# Patient Record
Sex: Female | Born: 1955 | Race: White | Hispanic: No | Marital: Single | Smoking: Former smoker
Health system: Southern US, Community
[De-identification: ages and names within clinical notes are randomized; demographics above are authoritative.]

## PROBLEM LIST (undated history)

## (undated) DIAGNOSIS — E079 Disorder of thyroid, unspecified: Secondary | ICD-10-CM

## (undated) DIAGNOSIS — K769 Liver disease, unspecified: Secondary | ICD-10-CM

## (undated) DIAGNOSIS — F329 Major depressive disorder, single episode, unspecified: Secondary | ICD-10-CM

## (undated) DIAGNOSIS — R079 Chest pain, unspecified: Secondary | ICD-10-CM

## (undated) DIAGNOSIS — E039 Hypothyroidism, unspecified: Secondary | ICD-10-CM

## (undated) DIAGNOSIS — M1990 Unspecified osteoarthritis, unspecified site: Secondary | ICD-10-CM

## (undated) DIAGNOSIS — J449 Chronic obstructive pulmonary disease, unspecified: Secondary | ICD-10-CM

## (undated) DIAGNOSIS — F32A Depression, unspecified: Secondary | ICD-10-CM

## (undated) DIAGNOSIS — Z9981 Dependence on supplemental oxygen: Secondary | ICD-10-CM

## (undated) DIAGNOSIS — K219 Gastro-esophageal reflux disease without esophagitis: Secondary | ICD-10-CM

## (undated) DIAGNOSIS — E785 Hyperlipidemia, unspecified: Secondary | ICD-10-CM

## (undated) DIAGNOSIS — I10 Essential (primary) hypertension: Secondary | ICD-10-CM

## (undated) HISTORY — PX: BREAST SURGERY: SHX581

## (undated) HISTORY — PX: BREAST BIOPSY: SHX20

## (undated) HISTORY — PX: CERVICAL DISC SURGERY: SHX588

## (undated) HISTORY — PX: TUBAL LIGATION: SHX77

## (undated) HISTORY — PX: ABDOMINAL HYSTERECTOMY: SHX81

## (undated) HISTORY — PX: COLONOSCOPY W/ POLYPECTOMY: SHX1380

## (undated) HISTORY — DX: Chest pain, unspecified: R07.9

## (undated) HISTORY — PX: EXPLORATORY LAPAROTOMY: SUR591

---

## 2004-05-01 ENCOUNTER — Emergency Department (HOSPITAL_COMMUNITY): Admission: EM | Admit: 2004-05-01 | Discharge: 2004-05-01 | Payer: Self-pay | Admitting: Emergency Medicine

## 2004-05-12 ENCOUNTER — Emergency Department (HOSPITAL_COMMUNITY): Admission: EM | Admit: 2004-05-12 | Discharge: 2004-05-12 | Payer: Self-pay | Admitting: Emergency Medicine

## 2005-06-17 ENCOUNTER — Emergency Department (HOSPITAL_COMMUNITY): Admission: EM | Admit: 2005-06-17 | Discharge: 2005-06-18 | Payer: Self-pay | Admitting: Emergency Medicine

## 2006-04-26 ENCOUNTER — Inpatient Hospital Stay (HOSPITAL_COMMUNITY): Admission: AD | Admit: 2006-04-26 | Discharge: 2006-04-26 | Payer: Self-pay | Admitting: Gynecology

## 2006-04-29 ENCOUNTER — Encounter: Admission: RE | Admit: 2006-04-29 | Discharge: 2006-04-29 | Payer: Self-pay | Admitting: Family Medicine

## 2006-05-07 ENCOUNTER — Encounter: Admission: RE | Admit: 2006-05-07 | Discharge: 2006-05-07 | Payer: Self-pay | Admitting: Family Medicine

## 2006-05-26 ENCOUNTER — Encounter (INDEPENDENT_AMBULATORY_CARE_PROVIDER_SITE_OTHER): Payer: Self-pay | Admitting: *Deleted

## 2006-05-26 ENCOUNTER — Encounter: Admission: RE | Admit: 2006-05-26 | Discharge: 2006-05-26 | Payer: Self-pay | Admitting: General Surgery

## 2006-06-14 ENCOUNTER — Encounter: Admission: RE | Admit: 2006-06-14 | Discharge: 2006-06-14 | Payer: Self-pay | Admitting: General Surgery

## 2006-06-14 ENCOUNTER — Ambulatory Visit (HOSPITAL_BASED_OUTPATIENT_CLINIC_OR_DEPARTMENT_OTHER): Admission: RE | Admit: 2006-06-14 | Discharge: 2006-06-14 | Payer: Self-pay | Admitting: General Surgery

## 2006-06-14 ENCOUNTER — Encounter (INDEPENDENT_AMBULATORY_CARE_PROVIDER_SITE_OTHER): Payer: Self-pay | Admitting: Specialist

## 2007-06-08 ENCOUNTER — Encounter: Admission: RE | Admit: 2007-06-08 | Discharge: 2007-06-08 | Payer: Self-pay | Admitting: Family Medicine

## 2007-12-19 ENCOUNTER — Encounter: Admission: RE | Admit: 2007-12-19 | Discharge: 2007-12-19 | Payer: Self-pay | Admitting: Family Medicine

## 2008-06-19 ENCOUNTER — Ambulatory Visit (HOSPITAL_COMMUNITY): Admission: RE | Admit: 2008-06-19 | Discharge: 2008-06-20 | Payer: Self-pay | Admitting: Specialist

## 2009-10-03 ENCOUNTER — Encounter (INDEPENDENT_AMBULATORY_CARE_PROVIDER_SITE_OTHER): Payer: Self-pay | Admitting: *Deleted

## 2009-11-28 ENCOUNTER — Encounter (INDEPENDENT_AMBULATORY_CARE_PROVIDER_SITE_OTHER): Payer: Self-pay | Admitting: *Deleted

## 2009-12-09 ENCOUNTER — Encounter: Admission: RE | Admit: 2009-12-09 | Discharge: 2009-12-09 | Payer: Self-pay | Admitting: Internal Medicine

## 2009-12-12 ENCOUNTER — Ambulatory Visit (HOSPITAL_COMMUNITY): Admission: RE | Admit: 2009-12-12 | Discharge: 2009-12-12 | Payer: Self-pay | Admitting: *Deleted

## 2009-12-16 ENCOUNTER — Encounter (INDEPENDENT_AMBULATORY_CARE_PROVIDER_SITE_OTHER): Payer: Self-pay | Admitting: *Deleted

## 2009-12-16 ENCOUNTER — Ambulatory Visit: Payer: Self-pay | Admitting: Gastroenterology

## 2009-12-16 DIAGNOSIS — K5909 Other constipation: Secondary | ICD-10-CM | POA: Insufficient documentation

## 2009-12-16 DIAGNOSIS — K625 Hemorrhage of anus and rectum: Secondary | ICD-10-CM

## 2009-12-16 DIAGNOSIS — G894 Chronic pain syndrome: Secondary | ICD-10-CM | POA: Insufficient documentation

## 2009-12-18 ENCOUNTER — Ambulatory Visit: Payer: Self-pay | Admitting: Gastroenterology

## 2009-12-18 ENCOUNTER — Telehealth: Payer: Self-pay | Admitting: Gastroenterology

## 2009-12-20 ENCOUNTER — Encounter: Payer: Self-pay | Admitting: Gastroenterology

## 2010-07-29 NOTE — Miscellaneous (Signed)
Summary: bentyl rx.  Clinical Lists Changes  Medications: Added new medication of BENTYL 10 MG  CAPS (DICYCLOMINE HCL) take one tab three times a day before meals. - Signed Rx of BENTYL 10 MG  CAPS (DICYCLOMINE HCL) take one tab three times a day before meals.;  #100 x 3;  Signed;  Entered by: Darlyn Read RN;  Authorized by: Mardella Layman MD [facility];  Method used: Electronically to [facility]. [phone]*, [address] [place], [place], [place]  [address], Ph: [phone] or [phone], Fax: [phone]    Prescriptions: BENTYL 10 MG  CAPS (DICYCLOMINE HCL) take one tab three times a day before meals.  #100 x 3   Entered by:   Darlyn Read RN   Authorized by:   Mardella Layman MD [facility]   Signed by:   Darlyn Read RN on 12/18/2009   Method used:   Electronically to        [facility] [phone]* (retail)       [address] [place], [place]  [address]       Ph: [phone] or [phone]       Fax: [phone]   RxID:   [id number]

## 2010-07-29 NOTE — Letter (Signed)
Summary: New Patient letter  [facility]  [address] [place], [place] [address]   Phone: [phone]  Fax: [phone]       11/28/2009 MRN: [id number]  KENNETHA PEARMAN [address] [place], [place]  [address]  Dear Ms. [name],  Welcome to the Gastroenterology Division at [facility].    You are scheduled to see Dr. Jarold Motto on 12/16/2009 at 2:45PM on the 3rd floor at [facility], [address].  We ask that you try to arrive at our office 15 minutes prior to your appointment time to allow for check-in.  We would like you to complete the enclosed self-administered evaluation form prior to your visit and bring it with you on the day of your appointment.  We will review it with you.  Also, please bring a complete list of all your medications or, if you prefer, bring the medication bottles and we will list them.  Please bring your insurance card so that we may make a copy of it.  If your insurance requires a referral to see a specialist, please bring your referral form from your primary care physician.  Co-payments are due at the time of your visit and may be paid by cash, check or credit card.     Your office visit will consist of a consult with your physician (includes a physical exam), any laboratory testing he/she may order, scheduling of any necessary diagnostic testing (e.g. x-ray, ultrasound, CT-scan), and scheduling of a procedure (e.g. Endoscopy, Colonoscopy) if required.  Please allow enough time on your schedule to allow for any/all of these possibilities.    If you cannot keep your appointment, please call [phone] to cancel or reschedule prior to your appointment date.  This allows [place] the opportunity to schedule an appointment for another patient in need of care.  If you do not cancel or reschedule by 5 p.m. the business day prior to your appointment date, you will be charged a $50.00 late cancellation/no-show fee.    Thank you for  choosing [facility] for your medical needs.  We appreciate the opportunity to care for you.  Please visit [place] at our website  to learn more about our practice.                     Sincerely,                                                             The Gastroenterology Division

## 2010-07-29 NOTE — Letter (Signed)
Summary: New Patient letter  [facility]  [address] [place], [place] [address]   Phone: [phone]  Fax: [phone]       10/03/2009 MRN: [id number]  Stephanie Buck [address] [place], [place]  [address]  Dear Ms. Stephanie Buck,  Welcome to the Gastroenterology Division at [facility].    You are scheduled to see Dr. Arlyce Dice on 11-04-09 at 8:30a.m. on the 3rd floor at [facility], [address].  We ask that you try to arrive at our office 15 minutes prior to your appointment time to allow for check-in.  We would like you to complete the enclosed self-administered evaluation form prior to your visit and bring it with you on the day of your appointment.  We will review it with you.  Also, please bring a complete list of all your medications or, if you prefer, bring the medication bottles and we will list them.  Please bring your insurance card so that we may make a copy of it.  If your insurance requires a referral to see a specialist, please bring your referral form from your primary care physician.  Co-payments are due at the time of your visit and may be paid by cash, check or credit card.     Your office visit will consist of a consult with your physician (includes a physical exam), any laboratory testing he/she may order, scheduling of any necessary diagnostic testing (e.g. x-ray, ultrasound, CT-scan), and scheduling of a procedure (e.g. Endoscopy, Colonoscopy) if required.  Please allow enough time on your schedule to allow for any/all of these possibilities.    If you cannot keep your appointment, please call [phone] to cancel or reschedule prior to your appointment date.  This allows [place] the opportunity to schedule an appointment for another patient in need of care.  If you do not cancel or reschedule by 5 p.m. the business day prior to your appointment date, you will be charged a $50.00 late cancellation/no-show fee.    Thank you for  choosing [facility] for your medical needs.  We appreciate the opportunity to care for you.  Please visit [place] at our website  to learn more about our practice.                     Sincerely,                                                             The Gastroenterology Division

## 2010-07-29 NOTE — Letter (Signed)
Summary: Patient Notice- Polyp Results  [facility]  [address] [place], [place] [address]   Phone: [phone]  Fax: [phone]        December 20, 2009 MRN: [id number]    Stephanie Buck [address] [place], [place]  [address]    Dear Ms. [name],  I am pleased to inform you that the colon polyp(s) removed during your recent colonoscopy was (were) found to be benign (no cancer detected) upon pathologic examination.  I recommend you have a repeat colonoscopy examination in 5_ years to look for recurrent polyps, as having colon polyps increases your risk for having recurrent polyps or even colon cancer in the future.  Should you develop new or worsening symptoms of abdominal pain, bowel habit changes or bleeding from the rectum or bowels, please schedule an evaluation with either your primary care physician or with me.  Additional information/recommendations:  __ No further action with gastroenterology is needed at this time. Please      follow-up with your primary care physician for your other healthcare      needs.  __ Please call [phone] to schedule a return visit to review your      situation.  __ Please keep your follow-up visit as already scheduled.  X__ Continue treatment plan as outlined the day of your exam.  Please call us if you are having persistent problems or have questions about your condition that have not been fully answered at this time.  Sincerely,  Mardella Layman MD [facility]  This letter has been electronically signed by your physician.  Appended Document: Patient Notice- Polyp Results letter mailed.

## 2010-07-29 NOTE — Letter (Signed)
Summary: [facility] Instructions  [facility]  [address] [place], [place] [address]   Phone: [phone]  Fax: [phone]       Stephanie Buck    Dec 16, 1955    MRN: [id number]        Procedure Day Dorna Bloom: Wednesday, 12/18/09     Arrival Time: 7:30      Procedure Time: 8:30     Location of Procedure:                    _X _  [facility] (4th Floor)                        PREPARATION FOR COLONOSCOPY WITH MOVIPREP   Starting today do not eat nuts, seeds, popcorn, corn, beans, peas,  salads, or any raw vegetables.  Do not take any fiber supplements (e.g. Metamucil, Citrucel, and Benefiber).  THE DAY BEFORE YOUR PROCEDURE         DATE: 12/17/09    DAY: Tuesday  1.  Drink clear liquids the entire day-NO SOLID FOOD  2.  Do not drink anything colored red or purple.  Avoid juices with pulp.  No orange juice.  3.  Drink at least 64 oz. (8 glasses) of fluid/clear liquids during the day to prevent dehydration and help the prep work efficiently.  CLEAR LIQUIDS INCLUDE: Water Jello Ice Popsicles Tea (sugar ok, no milk/cream) Powdered fruit flavored drinks Coffee (sugar ok, no milk/cream) Gatorade Juice: apple, white grape, white cranberry  Lemonade Clear bullion, consomm, broth Carbonated beverages (any kind) Strained chicken noodle soup Hard Candy                             4.  In the morning, mix first dose of MoviPrep solution:    Empty 1 Pouch A and 1 Pouch B into the disposable container    Add lukewarm drinking water to the top line of the container. Mix to dissolve    Refrigerate (mixed solution should be used within 24 hrs)  5.  Begin drinking the prep at 5:00 p.m. The MoviPrep container is divided by 4 marks.   Every 15 minutes drink the solution down to the next mark (approximately 8 oz) until the full liter is complete.   6.  Follow completed prep with 16 oz of clear liquid of your choice (Nothing red or purple).  Continue to  drink clear liquids until bedtime.  7.  Before going to bed, mix second dose of MoviPrep solution:    Empty 1 Pouch A and 1 Pouch B into the disposable container    Add lukewarm drinking water to the top line of the container. Mix to dissolve    Refrigerate  THE DAY OF YOUR PROCEDURE      DATE: 12/18/09   DAY: Wednesday  Beginning at 3:30a.m. (5 hours before procedure):         1. Every 15 minutes, drink the solution down to the next mark (approx 8 oz) until the full liter is complete.  2. Follow completed prep with 16 oz. of clear liquid of your choice.    3. You may drink clear liquids until 6:30 (2 HOURS BEFORE PROCEDURE).   MEDICATION INSTRUCTIONS  Unless otherwise instructed, you should take regular prescription medications with a small sip of water   as early as possible the morning of your procedure.  OTHER INSTRUCTIONS  You will need a responsible adult at least 55 years of age to accompany you and drive you home.   This person must remain in the waiting room during your procedure.  Wear loose fitting clothing that is easily removed.  Leave jewelry and other valuables at home.  However, you may wish to bring a book to read or  an iPod/MP3 player to listen to music as you wait for your procedure to start.  Remove all body piercing jewelry and leave at home.  Total time from sign-in until discharge is approximately 2-3 hours.  You should go home directly after your procedure and rest.  You can resume normal activities the  day after your procedure.  The day of your procedure you should not:   Drive   Make legal decisions   Operate machinery   Drink alcohol   Return to work  You will receive specific instructions about eating, activities and medications before you leave.    The above instructions have been reviewed and explained to me by   _______________________    I fully understand and can verbalize these instructions  _____________________________ Date _________

## 2010-07-29 NOTE — Procedures (Signed)
Summary: Colonoscopy  Patient: [name] Note: All result statuses are Final unless otherwise noted.  Tests: (1) Colonoscopy (COL)   COL Colonoscopy           DONE     [facility]     [address]     [place], [place]  [address]           COLONOSCOPY PROCEDURE REPORT           PATIENT:  Stephanie Buck, Stephanie Buck  MR#:  [id number]     BIRTHDATE:  1956-05-21, 53 yrs. old  GENDER:  female     ENDOSCOPIST:  Vania Rea. Jarold Motto, MD, [facility]     REF. BY:     PROCEDURE DATE:  12/18/2009     PROCEDURE:  Colonoscopy with snare polypectomy     ASA CLASS:  Class II     INDICATIONS:  colorectal cancer screening, average risk     MEDICATIONS:   Fentanyl 100 mcg IV, Versed 10 mg IV           DESCRIPTION OF PROCEDURE:   After the risks benefits and     alternatives of the procedure were thoroughly explained, informed     consent was obtained.  Digital rectal exam was performed and     revealed perianal skin tags.   The LB CF-[id number] endoscope     was introduced through the anus and advanced to the cecum, which     was identified by both the appendix and ileocecal valve, without     limitations.  The quality of the prep was excellent, using     MoviPrep.  The instrument was then slowly withdrawn as the colon     was fully examined.     <<PROCEDUREIMAGES>>           FINDINGS:  Severe diverticulosis was found in the sigmoid to     descending colon segments.  There were multiple polyps identified     and removed. in the sigmoid to descending colon segments. 3-9 mm     flat polyps hot snare excised.  Internal and external hemorrhoids     were found.   Retroflexed views in the rectum revealed external     hemorrhoids.    The scope was then withdrawn from the patient and     the procedure completed.           COMPLICATIONS:  None     ENDOSCOPIC IMPRESSION:     1) Severe diverticulosis in the sigmoid to descending colon     segments     2) Polyps, multiple in the sigmoid to descending  colon segments           3) Internal and external hemorrhoids     R/O ADENOMAS.     RECOMMENDATIONS:     1) high fiber diet     2) metamucil or benefiber     3) Repeat Colonoscopy in 5 years.     REPEAT EXAM:  No           ______________________________     Vania Rea. Jarold Motto, MD, [facility]           CC:           n.     eSIGNED:   Vania Rea. Patterson at 12/18/2009 09:00 AM           [name], [id number]  Note: An exclamation mark (!) indicates a result that was not dispersed into  the flowsheet. Document Creation Date: 12/18/2009 9:00 AM _______________________________________________________________________  (1) Order result status: Final Collection or observation date-time: 12/18/2009 08:53 Requested date-time:  Receipt date-time:  Reported date-time:  Referring Physician:   Ordering Physician: Sheryn Bison [phone]) Specimen Source:  Source: Launa Grill Order Number: [id number] Lab site:   Appended Document: Colonoscopy 5Y F/U  Appended Document: Colonoscopy     Procedures Next Due Date:    Colonoscopy: 11/2014

## 2010-07-29 NOTE — Miscellaneous (Signed)
Summary: Librax Rx  Clinical Lists Changes  Medications: Added new medication of LIBRAX 2.5-5 MG  CAPS (CLIDINIUM-CHLORDIAZEPOXIDE) ac three times a day - Signed Rx of LIBRAX 2.5-5 MG  CAPS (CLIDINIUM-CHLORDIAZEPOXIDE) ac three times a day;  #100 x 6;  Signed;  Entered by: Doristine Church RN II;  Authorized by: Mardella Layman MD [facility];  Method used: Electronically to [facility]. [phone]*, [address] [place], [place], [place]  [address], Ph: [phone] or [phone], Fax: [phone] Observations: Added new observation of ALLERGY REV: Done (12/18/2009 9:16) Added new observation of NKA: T (12/18/2009 9:16)    Prescriptions: LIBRAX 2.5-5 MG  CAPS (CLIDINIUM-CHLORDIAZEPOXIDE) ac three times a day  #100 x 6   Entered by:   Doristine Church RN II   Authorized by:   Mardella Layman MD [facility]   Signed by:   Doristine Church RN II on 12/18/2009   Method used:   Electronically to        [facility] [phone]* (retail)       [address] [place], [place]  [address]       Ph: [phone] or [phone]       Fax: [phone]   RxID:   [id number]

## 2010-07-29 NOTE — Progress Notes (Signed)
Summary: insurance will not cover med  Phone Note Call from Patient Call back at Home Phone [phone]   Caller: Patient Call For: Jarold Motto Reason for Call: Talk to Nurse Summary of Call: Patient states that her insurance company will not cover LIbrax, is there an alternative medication? Initial call taken by: Tawni Levy,  December 18, 2009 4:03 PM  Follow-up for Phone Call        RETURNED PHONE CALL AND CALL PATIENT AN ALTERNATIVE MEDICATION INTO PHARMACY. Follow-up by: Darlyn Read RN,  December 18, 2009 4:07 PM

## 2010-07-29 NOTE — Assessment & Plan Note (Signed)
Summary: GI BLEED...AS.   History of Present Illness Visit Type: consult Primary GI MD: Sheryn Bison MD FACP FAGA Primary Provider: Quitman Livings, MD Requesting Provider: Quitman Livings, MD Chief Complaint: pain in right side and occasional blood seen on toilet tissue, no blood on stool or in toilet History of Present Illness:   55 year old Caucasian female who has had a symptomatic rectal bleeding periodically for the last month with associated crampy right lower quadrant pain which actually improved with Cipro therapy for presumed UTI. She has mild constipation and intermittent bright red blood per rectum. Apparently lab data has been checked and she is not anemic although I do not have all of his records. Family history is remarkable for colon carcinoma in an aunt at age 76. Patient has had previous hysterectomy and probable bilateral ovary removal. 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 She" strangle easily" but denies dysphasia or chronic GERD. She has not had previous endoscopic exams of her gut. She denies abuse of alcohol but does smoke regularly. She also takes p.r.n. ibuprofen for pain. She is disabled.   GI Review of Systems    Reports abdominal pain, bloating, nausea, and  weight gain.      Denies acid reflux, belching, chest pain, dysphagia with liquids, dysphagia with solids, heartburn, loss of appetite, vomiting, vomiting blood, and  weight loss.      Reports change in bowel habits.     Denies anal fissure, black tarry stools, constipation, diarrhea, diverticulosis, fecal incontinence, heme positive stool, hemorrhoids, irritable bowel syndrome, jaundice, light color stool, liver problems, rectal bleeding, and  rectal pain. Preventive Screening-Counseling & Management  Alcohol-Tobacco     Smoking Status: current      Drug Use:  no.      Current Medications (verified): 1)  Percocet 5-325 Mg Tabs  (Oxycodone-Acetaminophen) .... Take One Tablet Once Daily As Needed 2)  Alprazolam 0.5 Mg Tabs (Alprazolam) .... Take 1 Tablet By Mouth Two Times A Day 3)  Lovastatin 20 Mg Tabs (Lovastatin) .... Take 1 Tablet By Mouth Once A Day 4)  Proventil Hfa 108 (90 Base) Mcg/act Aers (Albuterol Sulfate) .... As Needed 5)  Ibuprofen 800 Mg Tabs (Ibuprofen) .... Take As Needed  Allergies (verified): No Known Drug Allergies  Past History:  Past medical, surgical, family and social histories (including risk factors) reviewed for relevance to current acute and chronic problems.  Past Medical History: Anxiety Disorder Asthma Hyperlipidemia  Past Surgical History: Hysterectomy  Family History: Reviewed history and no changes required. No FH of Colon Cancer: Family History of Prostate Cancer: Father Family History of Colon Polyps: Mat. Aunt Family History of Diabetes: Father, Mother, PGM, MGM, Sister Family History of Heart Disease: Brother, PGF Family History of Kidney Disease: Sister(not sister w/diabetes)  Social History: Reviewed history and no changes required. Divorced, 1 boy Disabled Patient currently smokes.  Alcohol Use - no Daily Caffeine Use 2/day Illicit Drug Use - no Smoking Status:  current Drug Use:  no  Review of Systems       The patient complains of back pain, breast changes/lumps, change in vision, cough, fatigue, muscle pains/cramps, shortness of breath, sleeping problems, urination - excessive, and urine leakage.  The patient denies allergy/sinus, anemia, anxiety-new, arthritis/joint pain, blood in urine, confusion, coughing up blood, depression-new, fainting, fever, headaches-new, hearing problems, heart murmur, heart rhythm changes, itching, menstrual pain, night sweats, nosebleeds, pregnancy symptoms, skin rash, sore throat,  swelling of feet/legs, swollen lymph glands, thirst - excessive, urination changes/pain, and voice change.    Vital Signs:  Patient profile:    55 year old female Height:      65 inches Weight:      182 pounds BMI:     30.40 BSA:     1.90 Pulse rate:   100 / minute Pulse rhythm:   regular BP sitting:   106 / 64  (left arm) Cuff size:   regular  Vitals Entered By: Francee Piccolo CMA Duncan Dull) (December 16, 2009 2:46 PM)  Physical Exam  General:  Well developed, well nourished, no acute distress.healthy appearing.   Head:  Normocephalic and atraumatic. Eyes:  PERRLA, no icterus.exam deferred to patient's ophthalmologist.   Neck:  Supple; no masses or thyromegaly. Lungs:  Clear throughout to auscultation. Heart:  Regular rate and rhythm; no murmurs, rubs,  or bruits. Abdomen:  Soft, nontender and nondistended. No masses, hepatosplenomegaly or hernias noted. Normal bowel sounds. Rectal:  Normal exam.hemocult negative.   Msk:  Symmetrical with no gross deformities. Normal posture. Pulses:  Normal pulses noted. Extremities:  No clubbing, cyanosis, edema or deformities noted. Neurologic:  Alert and  oriented x4;  grossly normal neurologically. Skin:  Intact without significant lesions or rashes. Cervical Nodes:  No significant cervical adenopathy. Psych:  Alert and cooperative. Normal mood and affect.   Impression & Recommendations:  Problem # 1:  RECTAL BLEEDING (ICD-569.3) Assessment Improved Probable hemorrhoidal bleeding--rule out colonic polyposis. Colonoscopy has been scheduled at her convenience. Orders: Colonoscopy (Colon)  Problem # 2:  OTHER CONSTIPATION (ICD-564.09) Assessment: Improved High-Fiber diet as tolerated with liberal p.o. fluids. Orders: Colonoscopy (Colon)  Problem # 3:  FAMILY HX COLON CANCER (ICD-V16.0) Assessment: Comment Only  Problem # 4:  CHRONIC PAIN SYNDROME (ICD-338.4) Assessment: Unchanged  Patient Instructions: 1)  You are scheduled for a colonoscopy.   2)  You may pick up your prep at your pharmacy. 3)  Please continue current medications.  4)  The medication list was reviewed  and reconciled.  All changed / newly prescribed medications were explained.  A complete medication list was provided to the patient / caregiver. 5)  High Fiber, Low Fat  Healthy Eating Plan brochure given.  6)  Constipation and Hemorrhoids brochure given.  7)  Colonoscopy and Flexible Sigmoidoscopy brochure given.  8)  Conscious Sedation brochure given.  9)  Copy sent to : Dr.Sami Hassan. Prescriptions: MOVIPREP 100 GM  SOLR (PEG-KCL-NACL-NASULF-NA ASC-C) As per prep instructions.  #1 x 0   Entered by:   Ashok Cordia RN   Authorized by:   Mardella Layman MD [facility]   Signed by:   Ashok Cordia RN on 12/16/2009   Method used:   Electronically to        [facility] [phone]* (retail)       [address] [place], [place]  [address]       Ph: [phone] or [phone]       Fax: [phone]   RxID:   [id number]

## 2010-11-11 NOTE — Op Note (Signed)
[name], Stephanie Buck               ACCOUNT NO.:  [id number]   MEDICAL RECORD NO.:  [id number]          PATIENT TYPE:  OIB   LOCATION:  5016                         FACILITY:  [facility]   PHYSICIAN:  Kerrin Champagne, M.D.   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   POSTOPERATIVE DIAGNOSIS:  Central right-sided herniated nucleus pulposus  C5-6 affecting the right C6 and C7 nerve roots.   PROCEDURE:  Anterior cervical diskectomy and fusion at the C5-6 level  with 8-mm transgraft and a local bone graft harvested, 14 mm Trestle  Alphatec plate with 84-XL screws.   ASSISTANT:  Wende Neighbors, P.A.   ANESTHESIA:  General via orotracheal intubation, Dr. Krista Blue.   FINDINGS:  HNP, right L5-S1, right C6 nerve root foraminal entrapment,  degenerative disk changes.   ESTIMATED BLOOD LOSS:  50 mL.   COMPLICATIONS:  None.   DRAINS:  Single TLS drain, 7 [place], anterior neck.  The patient  returned to the PACU in good condition.   HISTORY OF PRESENT ILLNESS:  The patient is a 55 year old female who has  been experiencing severe neck pain and radiation to the right upper  extremity with numbness and paresthesias.  She has been evaluated by  hand Dr. Cindee Salt, felt to have findings most consistent with cervical  radiculopathy, was sent for evaluation.  She has diffuse weakness in the  right upper extremity involving C6-C7 nerve roots, and her biceps  reflexes diminished on the right side.  The patient underwent attempts  at conservative management.  However, these were unsuccessful  unfortunately, and unable to tolerate steroid medicines.  MRI scan  demonstrates a large protruded disk, right side, C6-7, C6 nerve root  compression, C4-5 with a small central protrusion, touching the  thecal  sac, but not causing cord compression, and a right-sided foraminal  narrowing at C6 and C7.   FINDINGS:  As her findings primarily seem to represent that of a C6 with  a disk protrusion to the right side affecting the right side of the  thecal sac, potentially affecting the C7 nerve root before its entry  into the C7 neural foramen.  The patient is felt to be a candidate for a  primary single-level anterior discectomy with fusion at C5-6 alone.  It  was felt that a two-level fusion likely would result in necessity for a  three-level fusion due to the changes either above or below at this  single fusion site.  The patient is brought to the operating room to  undergo anterior diskectomy and fusion at C5-C6.   INTRAOPERATIVE FINDINGS:  The patient has spondylosis changes and disk  protrusion on the right side, and spondylosis involving the posterior  lip inferiorly at C5.  It did compress the C7 nerve root in the lateral  aspect of the thecal sac.   DESCRIPTION OF THE PROCEDURE:  After adequate general anesthesia, the  patient was placed on the operating room table.  She had her head held  by well-added [place] horseshoe with the arms at the sides, skids in  place well padded.  TED hose and PAS stockings.  Standard prep with  DuraPrep solution over the anterior neck, 5 pounds cervical Holter  traction.  Standard preoperative antibiotics with Ancef.  She had  marking on the left neck prior to the surgical procedure.  Intraoperative time out identifying the patient and the procedure to be  performed.  She had an incision in line with the patient's skin creases  and about the C5-6 level localized with the cricothyroid cartilage.  Incision through skin and subcutaneous layers directly down the  platysma, approximately 3-3-1/2 inches in length.  Platysma layer was  then divided in line with the skin incision.  Bleeders controlled using  bipolar electrocautery.  The incision was  spread, omohyoid muscle  identified, retracted and divided.  Interval between the trachea and  esophagus medially and carotid sheath laterally then developed with the  blunt dissection at the anterior aspect of the cervical spine.   Small veins crossing the prevertebral fascia from the central to lateral  was carefully suture ligated with 2-0 Vicryl suture and divided.  The  patient's esophagus and trachea were then mobilized laterally, and the  anterior aspect of the cervical spine identified.  Carotid tubercle  identified and palpated.  This represented the C6 level.  A level just  directly above this felt to be C5-C6 disk space and a needle bed in such  a manner as to only allow insertion of a centimeter, so the needle was  placed at this level.  Intraoperative lateral radiograph demonstrated  the needle at the C5-6 level for planned surgery.  Medial borderof the  longus coli muscle carefully freed up on either side of the disk space  at C5-6, and Bose-McCullough retractor inserted obtaining excellent  exposure.   A 15-blade scalpel was used incised the disk.  Pituitary rongeur used to  debride the disk.  Bony anterior lip osteophytes were resected and  preserved for lateral bone grafting purposes in the disk space.  Further  bone graft tear was obtained from posterior lip osteophytes as well as  bone along the anterior edges and margins of the disk space.  Curettage  carried out over the disk space removing any degenerative disk material  and cartilaginous end-plates of inferior C5 and superior C6, and disk  space debrided with degenerative disk material back to the posterior lip  osteophytes and degenerated annular fibers.  Operating room microscope  sterilely draped run into the field under direct observation, and the  posterior aspect of the disk was then debrided to the patient's  posterior longitudinal ligament.  This was then resected in total with  posterior lip osteophyte  resected out the posterior aspect of the C5 and  C6 levels inferiorly.  Right C6 neural foramen was then carefully  debrided of degenerative disk material removing pressure on the nerve  root area.  Foraminotomy performed at this level till the nerve was  completely freed.   Next, the end-plate was carefully debrided using high-speed bur down the  bony end-plates measured for height 7 and 8 mm.  The 8 mm was felt to  represent the best height.  Transgraft was brought on to the field and  the bone graft harvested was then packed with a central opening.  Irrigation of the disk space carried out.  Careful inspection  of the  disk space demonstrated that no bony debridement could be retropulsed at  the insertion of the graft.  The graft was then inserted and packed into  place, retroset beneath the anterior lip of the disk space about 1 to 2  mm.  A 5-ounce cervical Holter traction was released.  The patient's  screw posts were placed on the vertebral body at C5 and C6 were removed  and bone wax applied to bleeding cancellous bone surfaces.   A 14 mm Trestel plate was then placed across the anterior cervical spine  as a single retaining pin on the left side, C6 and a first screw was  placed on the left side at the C5 level screwing this home, after first  drilling with a 14-mm drill, positive stop.  A 14-mm screw was placed  obtaining excellent purchase.  The retaining pin on the left side at C6  was removed, drill then applied, and then another 14-mm screw was placed  each of these capturing the plate locking mechanism quite nicely.  Attention was then turned to the right side at C6 and then drilling with  a 14-mm drill, and then placing the screw at C5 level on the right side,  again drilling with a 14-mm drill placing the appropriate screw.   Intraoperative radiographs obtained with some slight traction of the  arms demonstrating the screws, plate, and graft in excellent position  and  alignment.  No sign of bone graft or retropulsion of bone graft or  instrumentation.  Further irrigation was then carried out.  Careful  inspection of the incision demonstrated no active bleeding present.  A 7-  [place] drain place at the depth of the incision exiting over the  anterior and inferior aspect of the incision site.  The omohyoid muscle  reapproximated with interrupted 3-0 Vicryl sutures.  The platysma layer  approximated with interrupted 3-0 Vicryl sutures, deep subcu layers with  interrupted 3-0 Vicryl sutures, and the skin closed with a running subcu  stitch with 4-0 Monocryl.  The patient then had Dermabond applied.  Note  the DLS drain was sewn in place.  10French tube.  Dressing 4 x 4's was  fixed to the skin with a hyperfixed tape, and a [place] collar  applied.  The patient was then reactivated, extubated, and returned to  the recovery room in satisfactory condition.  Note that all the  instrument and sponge counts were correct  Intraoperative time out,  standard preoperative protocol marking the incision initially  preoperatively.      Kerrin Champagne, M.D.  Electronically Signed     JEN/MEDQ  D:  06/19/2008  T:  06/20/2008  Job:  [id number]

## 2010-11-14 NOTE — Op Note (Signed)
NAMEJAMYRAH, Stephanie Buck               ACCOUNT NO.:  [id number]   MEDICAL RECORD NO.:  [id number]          PATIENT TYPE:  AMB   LOCATION:  DSC                          FACILITY:  [facility]   PHYSICIAN:  Leonie Man, M.D.   DATE OF BIRTH:  06-Jun-1956   DATE OF PROCEDURE:  06/14/2006  DATE OF DISCHARGE:                               OPERATIVE REPORT   PREOPERATIVE DIAGNOSES:  Right breast lesion, rule out carcinoma.   POSTOPERATIVE DIAGNOSIS:  Right breast lesion, rule out carcinoma.  Pathology pending.   PROCEDURE:  Is needle localization and excisional biopsy of right breast  lesion.   SURGEON:  Dr. Lurene Shadow   ASSISTANT:  OR tech   ANESTHESIA:  General.   SPECIMEN:  A lot of breast tissue right breast.   ESTIMATED BLOOD LOSS:  Was minimal.   COMPLICATIONS:  Is none.  The patient returned to PACU in excellent  condition.   INDICATIONS FOR PROCEDURE:  Stephanie Buck is a 55 year old female with a  strong family history of breast carcinoma who on screening mammogram is  noted to have a lesion in the right breast, evaluated by core needle  biopsy which showed this to be a benign lesion.  However, because of the  morphology of the lesion excisional biopsy is indicated.  The patient  comes to the operating room now after risks and potential benefits of  surgery have been discussed.  All questions answered and consent  obtained.   PROCEDURE:  The patient positioned supinely following the induction of  general anesthesia the right breast was prepped and draped to be  included in a sterile operative field.  The localizing needle in the  center of the field.  An elliptical incision was carried down around the  localizing needle.  This was deepened through skin and subcutaneous  tissues raising flaps and all directions and carrying a wide margin of  tissue around the localizing needle almost to the chest wall.  The  entire specimen removed and forward for specimen mammography.  Specimen  mammography showed the clip to be in the center of the incision along  with the mass.  The specimen was then forwarded for pathologic  evaluation.  Pathology is pending.  The breast was then checked for  hemostasis.  Additional bleeding points treated with electrocautery.  Sponge and instrument counts verified.  The breast tissues were  reapproximated with interrupted 2-0 Vicryl sutures.  Subcutaneous  tissues with interrupted 3-0 Vicryl sutures and the skin closed  with 4-0 Monocryl running intradermal stitch.  This was then reinforced  with Steri-Strips.  Sterile dressings applied.  The anesthetic reversed  and the patient removed from the operating room to the recovery room in  stable condition.  She tolerated the procedure well.      Leonie Man, M.D.  Electronically Signed     PB/MEDQ  D:  06/14/2006  T:  06/14/2006  Job:  [id number]

## 2010-12-30 ENCOUNTER — Other Ambulatory Visit: Payer: Self-pay | Admitting: Internal Medicine

## 2010-12-30 DIAGNOSIS — Z1231 Encounter for screening mammogram for malignant neoplasm of breast: Secondary | ICD-10-CM

## 2011-01-06 ENCOUNTER — Ambulatory Visit
Admission: RE | Admit: 2011-01-06 | Discharge: 2011-01-06 | Disposition: A | Discharge status: Home / Self Care | Payer: Medicare Other | Source: Ambulatory Visit | Attending: Internal Medicine | Admitting: Internal Medicine

## 2011-01-06 DIAGNOSIS — Z1231 Encounter for screening mammogram for malignant neoplasm of breast: Secondary | ICD-10-CM

## 2011-04-03 LAB — COMPREHENSIVE METABOLIC PANEL
ALT: 52 U/L — ABNORMAL HIGH (ref 0–35)
AST: 46 U/L — ABNORMAL HIGH (ref 0–37)
Albumin: 3.7 g/dL (ref 3.5–5.2)
Alkaline Phosphatase: 162 U/L — ABNORMAL HIGH (ref 39–117)
BUN: 13 mg/dL (ref 6–23)
CO2: 32 mEq/L (ref 19–32)
Calcium: 9.6 mg/dL (ref 8.4–10.5)
Chloride: 104 mEq/L (ref 96–112)
Creatinine, Ser: 0.81 mg/dL (ref 0.4–1.2)
GFR calc Af Amer: >60 mL/min (ref >60)
GFR calc non Af Amer: >60 mL/min (ref >60)
Glucose, Bld: 96 mg/dL (ref 70–99)
Potassium: 4.9 mEq/L (ref 3.5–5.1)
Sodium: 142 mEq/L (ref 135–145)
Total Bilirubin: 0.5 mg/dL (ref 0.3–1.2)
Total Protein: 6.6 g/dL (ref 6.0–8.3)

## 2011-04-03 LAB — CBC
HCT: 43.6 % (ref 36.0–46.0)
Hemoglobin: 14.8 g/dL (ref 12.0–15.0)
MCHC: 34 g/dL (ref 30.0–36.0)
MCV: 93.6 fL (ref 78.0–100.0)
Platelets: 304 10*3/uL (ref 150–400)
RBC: 4.66 MIL/uL (ref 3.87–5.11)
RDW: 13.6 % (ref 11.5–15.5)
WBC: 7.3 10*3/uL (ref 4.0–10.5)

## 2011-04-03 LAB — PROTIME-INR
INR: 0.9 (ref 0.00–1.49)
Prothrombin Time: 12.4 seconds (ref 11.6–15.2)

## 2011-04-03 LAB — DIFFERENTIAL
Basophils Absolute: 0.1 10*3/uL (ref 0.0–0.1)
Basophils Relative: 1 % (ref 0–1)
Eosinophils Absolute: 0 10*3/uL (ref 0.0–0.7)
Eosinophils Relative: 1 % (ref 0–5)
Lymphocytes Relative: 22 % (ref 12–46)
Lymphs Abs: 1.6 10*3/uL (ref 0.7–4.0)
Monocytes Absolute: 0.6 10*3/uL (ref 0.1–1.0)
Monocytes Relative: 8 % (ref 3–12)
Neutro Abs: 5 10*3/uL (ref 1.7–7.7)
Neutrophils Relative %: 69 % (ref 43–77)

## 2011-04-03 LAB — APTT: aPTT: 26 seconds (ref 24–37)

## 2011-04-03 LAB — URINALYSIS, ROUTINE W REFLEX MICROSCOPIC
Bilirubin Urine: NEGATIVE
Glucose, UA: NEGATIVE mg/dL
Hgb urine dipstick: NEGATIVE
Ketones, ur: NEGATIVE mg/dL
Nitrite: NEGATIVE
Protein, ur: NEGATIVE mg/dL
Specific Gravity, Urine: 1.014 (ref 1.005–1.030)
Urobilinogen, UA: 1 mg/dL (ref 0.0–1.0)
pH: 7.5 (ref 5.0–8.0)

## 2011-05-14 ENCOUNTER — Ambulatory Visit (HOSPITAL_COMMUNITY)
Admission: RE | Admit: 2011-05-14 | Discharge: 2011-05-14 | Disposition: A | Discharge status: Home / Self Care | Payer: Medicare Other | Source: Ambulatory Visit | Attending: Nurse Practitioner | Admitting: Nurse Practitioner

## 2011-05-14 ENCOUNTER — Other Ambulatory Visit (HOSPITAL_COMMUNITY): Payer: Self-pay | Admitting: Nurse Practitioner

## 2011-05-14 ENCOUNTER — Other Ambulatory Visit: Payer: Self-pay | Admitting: Nurse Practitioner

## 2011-05-14 DIAGNOSIS — R0602 Shortness of breath: Secondary | ICD-10-CM

## 2011-05-14 DIAGNOSIS — M25579 Pain in unspecified ankle and joints of unspecified foot: Secondary | ICD-10-CM | POA: Insufficient documentation

## 2011-05-14 DIAGNOSIS — M25571 Pain in right ankle and joints of right foot: Secondary | ICD-10-CM

## 2011-05-15 ENCOUNTER — Other Ambulatory Visit: Payer: Self-pay | Admitting: Gastroenterology

## 2011-05-15 MED ORDER — DICYCLOMINE HCL 10 MG PO CAPS: 10.0000 mg | ORAL_CAPSULE | Freq: Three times a day (TID) | ORAL | Status: DC

## 2011-05-15 NOTE — Telephone Encounter (Signed)
Medication filled.  

## 2011-11-30 ENCOUNTER — Inpatient Hospital Stay (HOSPITAL_COMMUNITY)
Admission: EM | Admit: 2011-11-30 | Discharge: 2011-12-03 | DRG: 371 | Disposition: A | Discharge status: Home / Self Care | Payer: Medicare Other | Attending: Internal Medicine | Admitting: Internal Medicine

## 2011-11-30 ENCOUNTER — Emergency Department (HOSPITAL_COMMUNITY): Payer: Medicare Other

## 2011-11-30 ENCOUNTER — Encounter (HOSPITAL_COMMUNITY): Payer: Self-pay | Admitting: *Deleted

## 2011-11-30 DIAGNOSIS — R109 Unspecified abdominal pain: Secondary | ICD-10-CM | POA: Diagnosis present

## 2011-11-30 DIAGNOSIS — R197 Diarrhea, unspecified: Secondary | ICD-10-CM

## 2011-11-30 DIAGNOSIS — R0902 Hypoxemia: Secondary | ICD-10-CM

## 2011-11-30 DIAGNOSIS — J45901 Unspecified asthma with (acute) exacerbation: Secondary | ICD-10-CM

## 2011-11-30 DIAGNOSIS — R0602 Shortness of breath: Secondary | ICD-10-CM

## 2011-11-30 DIAGNOSIS — J449 Chronic obstructive pulmonary disease, unspecified: Secondary | ICD-10-CM | POA: Diagnosis present

## 2011-11-30 DIAGNOSIS — K625 Hemorrhage of anus and rectum: Secondary | ICD-10-CM

## 2011-11-30 DIAGNOSIS — R1110 Vomiting, unspecified: Secondary | ICD-10-CM

## 2011-11-30 DIAGNOSIS — J9600 Acute respiratory failure, unspecified whether with hypoxia or hypercapnia: Secondary | ICD-10-CM

## 2011-11-30 DIAGNOSIS — A0472 Enterocolitis due to Clostridium difficile, not specified as recurrent: Principal | ICD-10-CM | POA: Diagnosis present

## 2011-11-30 DIAGNOSIS — J441 Chronic obstructive pulmonary disease with (acute) exacerbation: Secondary | ICD-10-CM | POA: Diagnosis present

## 2011-11-30 DIAGNOSIS — K5909 Other constipation: Secondary | ICD-10-CM

## 2011-11-30 DIAGNOSIS — J439 Emphysema, unspecified: Secondary | ICD-10-CM | POA: Diagnosis present

## 2011-11-30 DIAGNOSIS — E785 Hyperlipidemia, unspecified: Secondary | ICD-10-CM | POA: Diagnosis present

## 2011-11-30 DIAGNOSIS — F17200 Nicotine dependence, unspecified, uncomplicated: Secondary | ICD-10-CM | POA: Diagnosis present

## 2011-11-30 DIAGNOSIS — K769 Liver disease, unspecified: Secondary | ICD-10-CM | POA: Diagnosis present

## 2011-11-30 DIAGNOSIS — R112 Nausea with vomiting, unspecified: Secondary | ICD-10-CM

## 2011-11-30 DIAGNOSIS — E873 Alkalosis: Secondary | ICD-10-CM | POA: Diagnosis present

## 2011-11-30 DIAGNOSIS — J4489 Other specified chronic obstructive pulmonary disease: Secondary | ICD-10-CM | POA: Diagnosis present

## 2011-11-30 DIAGNOSIS — F411 Generalized anxiety disorder: Secondary | ICD-10-CM | POA: Diagnosis present

## 2011-11-30 DIAGNOSIS — G894 Chronic pain syndrome: Secondary | ICD-10-CM

## 2011-11-30 HISTORY — DX: Hyperlipidemia, unspecified: E78.5

## 2011-11-30 HISTORY — DX: Chronic obstructive pulmonary disease, unspecified: J44.9

## 2011-11-30 LAB — COMPREHENSIVE METABOLIC PANEL
Albumin: 3.2 g/dL — ABNORMAL LOW (ref 3.5–5.2)
Alkaline Phosphatase: 93 U/L (ref 39–117)
BUN: 4 mg/dL — ABNORMAL LOW (ref 6–23)
Calcium: 9.3 mg/dL (ref 8.4–10.5)
GFR calc Af Amer: >90 mL/min (ref >90)
Glucose, Bld: 91 mg/dL (ref 70–99)
Potassium: 3.9 mEq/L (ref 3.5–5.1)
Sodium: 142 mEq/L (ref 135–145)
Total Protein: 6.6 g/dL (ref 6.0–8.3)

## 2011-11-30 LAB — LACTIC ACID, PLASMA: Lactic Acid, Venous: 0.8 mmol/L (ref 0.5–2.2)

## 2011-11-30 LAB — CBC
HCT: 45.7 % (ref 36.0–46.0)
Hemoglobin: 14.7 g/dL (ref 12.0–15.0)
MCH: 29.8 pg (ref 26.0–34.0)
MCHC: 32.2 g/dL (ref 30.0–36.0)
Platelets: 334 10*3/uL (ref 150–400)
RBC: 4.94 MIL/uL (ref 3.87–5.11)
WBC: 9.4 10*3/uL (ref 4.0–10.5)

## 2011-11-30 LAB — CARDIAC PANEL(CRET KIN+CKTOT+MB+TROPI)
Relative Index: INVALID (ref 0.0–2.5)
Total CK: 52 U/L (ref 7–177)

## 2011-11-30 LAB — URINALYSIS, ROUTINE W REFLEX MICROSCOPIC
Leukocytes, UA: NEGATIVE
Nitrite: NEGATIVE
Protein, ur: NEGATIVE mg/dL
Specific Gravity, Urine: 1.005 (ref 1.005–1.030)
Urobilinogen, UA: 0.2 mg/dL (ref 0.0–1.0)

## 2011-11-30 LAB — PREGNANCY, URINE: Preg Test, Ur: NEGATIVE

## 2011-11-30 LAB — DIFFERENTIAL
Basophils Relative: 0 % (ref 0–1)
Eosinophils Absolute: 0.2 10*3/uL (ref 0.0–0.7)
Eosinophils Relative: 2 % (ref 0–5)
Lymphs Abs: 1.4 10*3/uL (ref 0.7–4.0)
Monocytes Relative: 12 % (ref 3–12)
Neutrophils Relative %: 71 % (ref 43–77)

## 2011-11-30 LAB — LIPASE, BLOOD: Lipase: 17 U/L (ref 11–59)

## 2011-11-30 MED ORDER — ACETAMINOPHEN 325 MG PO TABS
650.0000 mg | ORAL_TABLET | Freq: Four times a day (QID) | ORAL | Status: DC | PRN
  Administered 2011-12-02 – 2011-12-03 (×2): 650 mg via ORAL
  Filled 2011-11-30 (×3): qty 2

## 2011-11-30 MED ORDER — CHLORHEXIDINE GLUCONATE 0.12 % MT SOLN
15.0000 mL | Freq: Two times a day (BID) | OROMUCOSAL | Status: DC
  Filled 2011-11-30 (×4): qty 15

## 2011-11-30 MED ORDER — SODIUM CHLORIDE 0.9 % IV BOLUS (SEPSIS)
1000.0000 mL | INTRAVENOUS | Status: AC
  Administered 2011-11-30: 1000 mL via INTRAVENOUS

## 2011-11-30 MED ORDER — IPRATROPIUM BROMIDE 0.02 % IN SOLN
0.5000 mg | Freq: Four times a day (QID) | RESPIRATORY_TRACT | Status: DC
  Administered 2011-12-01: 0.5 mg via RESPIRATORY_TRACT
  Filled 2011-11-30: qty 2.5

## 2011-11-30 MED ORDER — OXYCODONE-ACETAMINOPHEN 5-325 MG PO TABS
1.0000 | ORAL_TABLET | ORAL | Status: DC | PRN
  Administered 2011-12-01 – 2011-12-03 (×5): 1 via ORAL
  Filled 2011-11-30 (×5): qty 1

## 2011-11-30 MED ORDER — SODIUM CHLORIDE 0.9 % IJ SOLN
3.0000 mL | Freq: Two times a day (BID) | INTRAMUSCULAR | Status: DC
  Administered 2011-12-01: 3 mL via INTRAVENOUS

## 2011-11-30 MED ORDER — ONDANSETRON HCL 4 MG/2ML IJ SOLN: 4.0000 mg | Freq: Four times a day (QID) | INTRAMUSCULAR | Status: DC | PRN

## 2011-11-30 MED ORDER — ALBUTEROL SULFATE (5 MG/ML) 0.5% IN NEBU: 2.5000 mg | INHALATION_SOLUTION | RESPIRATORY_TRACT | Status: DC | PRN

## 2011-11-30 MED ORDER — ALPRAZOLAM 0.5 MG PO TABS
0.5000 mg | ORAL_TABLET | Freq: Two times a day (BID) | ORAL | Status: DC
  Administered 2011-12-01 – 2011-12-02 (×4): 0.5 mg via ORAL
  Filled 2011-11-30 (×4): qty 1

## 2011-11-30 MED ORDER — MORPHINE SULFATE 4 MG/ML IJ SOLN
4.0000 mg | Freq: Once | INTRAMUSCULAR | Status: AC
  Administered 2011-11-30: 4 mg via INTRAVENOUS
  Filled 2011-11-30: qty 1

## 2011-11-30 MED ORDER — METRONIDAZOLE 500 MG PO TABS
500.0000 mg | ORAL_TABLET | Freq: Three times a day (TID) | ORAL | Status: DC
  Administered 2011-12-01 – 2011-12-03 (×9): 500 mg via ORAL
  Filled 2011-11-30 (×12): qty 1

## 2011-11-30 MED ORDER — ONDANSETRON HCL 4 MG/2ML IJ SOLN
4.0000 mg | Freq: Once | INTRAMUSCULAR | Status: AC
  Administered 2011-11-30: 4 mg via INTRAVENOUS
  Filled 2011-11-30: qty 2

## 2011-11-30 MED ORDER — ALBUTEROL SULFATE (5 MG/ML) 0.5% IN NEBU
2.5000 mg | INHALATION_SOLUTION | Freq: Four times a day (QID) | RESPIRATORY_TRACT | Status: DC
  Administered 2011-12-01: 2.5 mg via RESPIRATORY_TRACT
  Filled 2011-11-30: qty 0.5

## 2011-11-30 MED ORDER — BUDESONIDE 0.5 MG/2ML IN SUSP
0.5000 mg | Freq: Two times a day (BID) | RESPIRATORY_TRACT | Status: DC
  Administered 2011-12-01 – 2011-12-03 (×4): 0.5 mg via RESPIRATORY_TRACT
  Filled 2011-11-30 (×7): qty 2

## 2011-11-30 MED ORDER — IPRATROPIUM BROMIDE 0.02 % IN SOLN
0.5000 mg | Freq: Once | RESPIRATORY_TRACT | Status: AC
  Administered 2011-11-30: 0.5 mg via RESPIRATORY_TRACT
  Filled 2011-11-30: qty 2.5

## 2011-11-30 MED ORDER — ALBUTEROL SULFATE HFA 108 (90 BASE) MCG/ACT IN AERS
4.0000 | INHALATION_SPRAY | RESPIRATORY_TRACT | Status: DC | PRN
  Administered 2011-11-30: 4 via RESPIRATORY_TRACT
  Filled 2011-11-30: qty 6.7

## 2011-11-30 MED ORDER — SIMVASTATIN 10 MG PO TABS
10.0000 mg | ORAL_TABLET | Freq: Every day | ORAL | Status: DC
  Administered 2011-12-01 – 2011-12-03 (×3): 10 mg via ORAL
  Filled 2011-11-30 (×3): qty 1

## 2011-11-30 MED ORDER — ALBUTEROL SULFATE (5 MG/ML) 0.5% IN NEBU
5.0000 mg | INHALATION_SOLUTION | Freq: Once | RESPIRATORY_TRACT | Status: DC
  Filled 2011-11-30: qty 1

## 2011-11-30 MED ORDER — FUROSEMIDE 10 MG/ML IJ SOLN
20.0000 mg | Freq: Once | INTRAMUSCULAR | Status: AC
  Administered 2011-11-30: 20 mg via INTRAVENOUS
  Filled 2011-11-30: qty 2

## 2011-11-30 MED ORDER — METHYLPREDNISOLONE SODIUM SUCC 125 MG IJ SOLR
125.0000 mg | Freq: Once | INTRAMUSCULAR | Status: AC
  Administered 2011-11-30: 125 mg via INTRAVENOUS
  Filled 2011-11-30: qty 2

## 2011-11-30 MED ORDER — BIOTENE DRY MOUTH MT LIQD: 15.0000 mL | Freq: Two times a day (BID) | OROMUCOSAL | Status: DC

## 2011-11-30 MED ORDER — ALBUTEROL (5 MG/ML) CONTINUOUS INHALATION SOLN
10.0000 mg/h | INHALATION_SOLUTION | Freq: Once | RESPIRATORY_TRACT | Status: AC
  Administered 2011-11-30: 10 mg/h via RESPIRATORY_TRACT
  Filled 2011-11-30: qty 20

## 2011-11-30 MED ORDER — ONDANSETRON HCL 4 MG PO TABS: 4.0000 mg | ORAL_TABLET | Freq: Four times a day (QID) | ORAL | Status: DC | PRN

## 2011-11-30 MED ORDER — PNEUMOCOCCAL VAC POLYVALENT 25 MCG/0.5ML IJ INJ
0.5000 mL | INJECTION | INTRAMUSCULAR | Status: AC
  Administered 2011-12-01: 0.5 mL via INTRAMUSCULAR
  Filled 2011-11-30: qty 0.5

## 2011-11-30 MED ORDER — ACETAMINOPHEN 650 MG RE SUPP: 650.0000 mg | Freq: Four times a day (QID) | RECTAL | Status: DC | PRN

## 2011-11-30 NOTE — ED Notes (Signed)
Respiratory called for continuous nebulizer treatment.  

## 2011-11-30 NOTE — ED Provider Notes (Addendum)
I have seen and examined this patient with the resident.  I agree with the resident's note, assessment and plan except as indicated.    Patient with nausea, vomiting and diarrhea since last Thursday.  It may be related to the clindamycin she was started on for a dental infection.  Patient has a benign abdomen at this time and so seems less concerning for an acute abdominal surgical process such as appendicitis or cholecystitis.  Patient's laboratory studies correlate with this.  Patient secondarily seems to have a moderate to severe asthma exacerbation she has decreased lung sounds on exam and hypoxia here.  She will receive steroids and continuous nebs to be monitored for improvement but may require admission due to her hypoxia.  Nat Christen, MD 11/30/11 1946  Discussed with Dr. Toniann Fail for Team 4, tele.    Nat Christen, MD 11/30/11 2205  I agree with the resident eCG read.   Nat Christen, MD 01/14/12 [phone]

## 2011-11-30 NOTE — ED Provider Notes (Signed)
History     CSN: [id number]  Arrival date & time 11/30/11  1708   None     Chief Complaint  Patient presents with  . Chest Pain  . Shortness of Breath  . Diarrhea  . Emesis    (Consider location/radiation/quality/duration/timing/severity/associated sxs/prior treatment) Patient is a 56 y.o. female presenting with diarrhea. The history is provided by the patient.  Diarrhea The primary symptoms include fever, nausea, vomiting, diarrhea and dysuria. Primary symptoms do not include fatigue or abdominal pain. The illness began 3 to 5 days ago. The onset was gradual. The problem has not changed since onset. The diarrhea began 3 to 5 days ago. The diarrhea is watery (occasional brb). The diarrhea occurs 2 to 4 times per day. Risk factors for illness producing diarrhea include recent antibiotic use.  The dysuria is not associated with hematuria.  The illness does not include back pain. Associated medical issues comments: IBS.    History reviewed. No pertinent past medical history.  Past Surgical History  Procedure Date  . Cervical disc surgery     No family history on file.  History  Substance Use Topics  . Smoking status: Current Everyday Smoker  . Smokeless tobacco: Not on file  . Alcohol Use: No    OB History    Grav Para Term Preterm Abortions TAB SAB Ect Mult Living                  Review of Systems  Constitutional: Positive for fever. Negative for fatigue.  HENT: Negative for congestion, drooling and neck pain.   Eyes: Negative for pain.  Respiratory: Positive for cough and shortness of breath.   Cardiovascular: Positive for chest pain.  Gastrointestinal: Positive for nausea, vomiting and diarrhea. Negative for abdominal pain.  Genitourinary: Positive for dysuria. Negative for hematuria.  Musculoskeletal: Negative for back pain and gait problem.  Skin: Negative for color change.  Neurological: Negative for dizziness and headaches.  Hematological: Negative for  adenopathy.  Psychiatric/Behavioral: Negative for behavioral problems.  All other systems reviewed and are negative.    Allergies  Clindamycin/lincomycin  Home Medications   Current Outpatient Rx  Name Route Sig Dispense Refill  . ALBUTEROL SULFATE HFA 108 (90 BASE) MCG/ACT IN AERS Inhalation Inhale 2 puffs into the lungs every 6 (six) hours as needed. For shortness of breath.    . ALPRAZOLAM 0.5 MG PO TABS Oral Take 0.5 mg by mouth 2 (two) times daily.    Marland Kitchen DICYCLOMINE HCL 10 MG PO CAPS Oral Take 10 mg by mouth 3 (three) times daily before meals.     . IBUPROFEN 200 MG PO TABS Oral Take 200-800 mg by mouth every 6 (six) hours as needed. For pain.    Marland Kitchen LOVASTATIN 20 MG PO TABS Oral Take 20 mg by mouth at bedtime.    . OXYCODONE-ACETAMINOPHEN 5-325 MG PO TABS Oral Take 1 tablet by mouth every 4 (four) hours as needed. For pain.    Marland Kitchen PSYLLIUM 95 % PO PACK Oral Take 1 packet by mouth daily.      BP 143/73  Pulse 108  Temp(Src) 97.6 F (36.4 C) (Oral)  Resp 22  SpO2 82%  Physical Exam  Nursing note and vitals reviewed. Constitutional: She is oriented to person, place, and time. She appears well-developed and well-nourished.  HENT:  Head: Normocephalic.  Mouth/Throat: No oropharyngeal exudate.  Eyes: Conjunctivae and EOM are normal. Pupils are equal, round, and reactive to light.  Neck: Normal range of  motion. Neck supple.  Cardiovascular: Normal rate, regular rhythm, normal heart sounds and intact distal pulses.  Exam reveals no gallop and no friction rub.   No murmur heard. Pulmonary/Chest: Effort normal. No respiratory distress. She has wheezes (mild wheezing bilaterally).  Abdominal: Soft. Bowel sounds are normal. There is tenderness (mild lower abdominal pain w/ palpation). There is no rebound and no guarding.  Musculoskeletal: Normal range of motion. She exhibits no edema and no tenderness.  Neurological: She is alert and oriented to person, place, and time.  Skin: Skin is  warm and dry.  Psychiatric: She has a normal mood and affect. Her behavior is normal.    ED Course  Procedures (including critical care time)   Labs Reviewed  CBC  DIFFERENTIAL  COMPREHENSIVE METABOLIC PANEL  LIPASE, BLOOD  LACTIC ACID, PLASMA  URINALYSIS, ROUTINE W REFLEX MICROSCOPIC  PREGNANCY, URINE  CLOSTRIDIUM DIFFICILE BY PCR   No results found.   No diagnosis found.   Date: 11/30/2011  Rate: 110  Rhythm: sinus tachycardia  QRS Axis: indeterminate  Intervals: normal  ST/T Wave abnormalities: normal  Conduction Disutrbances:none  Narrative Interpretation: No ST or T wave changes cw ischemia  Old EKG Reviewed: changes noted    MDM  5:45 PM 56 y.o. female w hx of diverticulitis pw N/V/D and lower abdominal pain for 5-6 days, worse in last 3 days. Pt notes fever of 102 approx 4 days ago. Pt notes intermittent cp and sob, O2 sat 92% on RA. Pt has hx of asthma and wheezing on exam. Will get labs, IVF, pain control. Will get c dif as pt has been on clindamycin for approx 1 week.   Pt has dec O2 sats into the upper 80's on exam. Gave solumedrol and 1 hr of nebs. Pt continues to desat to upper 80's after nebs. Will admit to hospitalist. Pt was unable to produce stool sample in ED for c. Dif.   Clinical Impression 1. Asthma exacerbation   2. Hypoxic   3. Vomiting   4. Diarrhea            Purvis Sheffield, MD 11/30/11 2307

## 2011-11-30 NOTE — ED Notes (Signed)
Consulting MD at bedside

## 2011-11-30 NOTE — H&P (Signed)
Stephanie Buck is an 56 y.o. female.   PCP - [facility]. Chief Complaint: Shortness of breath and nausea vomiting and diarrhea. HPI: 56 year-old female with known history of COPD, ongoing tobacco abuse, hyperlipidemia has had a recent dental extraction one week ago and was placed on clindamycin. Since then patient has been having some diarrhea. Which gradually worsened and last 2-3 days has been having nausea vomiting abdominal discomfort. She has been having multiple episodes of nausea vomiting and diarrhea. And her abdominal discomfort is generalized. She also noticed some blood in the stools. Denies any fever chills. Last 2-3 days patient also noticed that she has been getting short of breath with wheezing and exertional symptoms. She also has been having chest congestion. Denies any productive cough. In the ER despite multiple nebulizer doses patient has been feeling mildly short of breath. Patient has been admitted for further workup.  Past Medical History  Diagnosis Date  . COPD (chronic obstructive pulmonary disease)   . Hyperlipidemia     Past Surgical History  Procedure Date  . Cervical disc surgery     Family History  Problem Relation Age of Onset  . Coronary artery disease Mother   . Stroke Father    Social History:  reports that she has been smoking.  She does not have any smokeless tobacco history on file. She reports that she does not drink alcohol or use illicit drugs.  Allergies:  Allergies  Allergen Reactions  . Clindamycin/Lincomycin      (Not in a hospital admission)  Results for orders placed during the hospital encounter of 11/30/11 (from the past 48 hour(s))  URINALYSIS, ROUTINE W REFLEX MICROSCOPIC     Status: Normal   Collection Time   11/30/11  5:46 PM      Component Value Range Comment   Color, Urine YELLOW  YELLOW     APPearance CLEAR  CLEAR     Specific Gravity, Urine 1.005  1.005 - 1.030     pH 7.5  5.0 - 8.0     Glucose, UA NEGATIVE  NEGATIVE  (mg/dL)    Hgb urine dipstick NEGATIVE  NEGATIVE     Bilirubin Urine NEGATIVE  NEGATIVE     Ketones, ur NEGATIVE  NEGATIVE (mg/dL)    Protein, ur NEGATIVE  NEGATIVE (mg/dL)    Urobilinogen, UA 0.2  0.0 - 1.0 (mg/dL)    Nitrite NEGATIVE  NEGATIVE     Leukocytes, UA NEGATIVE  NEGATIVE  MICROSCOPIC NOT DONE ON URINES WITH NEGATIVE PROTEIN, BLOOD, LEUKOCYTES, NITRITE, OR GLUCOSE <1000 mg/dL.  PREGNANCY, URINE     Status: Normal   Collection Time   11/30/11  5:47 PM      Component Value Range Comment   Preg Test, Ur NEGATIVE  NEGATIVE    CBC     Status: Normal   Collection Time   11/30/11  5:56 PM      Component Value Range Comment   WBC 9.4  4.0 - 10.5 (K/uL)    RBC 4.94  3.87 - 5.11 (MIL/uL)    Hemoglobin 14.7  12.0 - 15.0 (g/dL)    HCT 14.7  82.9 - 56.2 (%)    MCV 92.5  78.0 - 100.0 (fL)    MCH 29.8  26.0 - 34.0 (pg)    MCHC 32.2  30.0 - 36.0 (g/dL)    RDW 13.0  86.5 - 78.4 (%)    Platelets 334  150 - 400 (K/uL)   DIFFERENTIAL  Status: Abnormal   Collection Time   11/30/11  5:56 PM      Component Value Range Comment   Neutrophils Relative 71  43 - 77 (%)    Neutro Abs 6.6  1.7 - 7.7 (K/uL)    Lymphocytes Relative 15  12 - 46 (%)    Lymphs Abs 1.4  0.7 - 4.0 (K/uL)    Monocytes Relative 12  3 - 12 (%)    Monocytes Absolute 1.1 (*) 0.1 - 1.0 (K/uL)    Eosinophils Relative 2  0 - 5 (%)    Eosinophils Absolute 0.2  0.0 - 0.7 (K/uL)    Basophils Relative 0  0 - 1 (%)    Basophils Absolute 0.0  0.0 - 0.1 (K/uL)   COMPREHENSIVE METABOLIC PANEL     Status: Abnormal   Collection Time   11/30/11  5:56 PM      Component Value Range Comment   Sodium 142  135 - 145 (mEq/L)    Potassium 3.9  3.5 - 5.1 (mEq/L)    Chloride 100  96 - 112 (mEq/L)    CO2 36 (*) 19 - 32 (mEq/L)    Glucose, Bld 91  70 - 99 (mg/dL)    BUN 4 (*) 6 - 23 (mg/dL)    Creatinine, Ser 9.60 (*) 0.50 - 1.10 (mg/dL)    Calcium 9.3  8.4 - 10.5 (mg/dL)    Total Protein 6.6  6.0 - 8.3 (g/dL)    Albumin 3.2 (*) 3.5 - 5.2  (g/dL)    AST 18  0 - 37 (U/L)    ALT 16  0 - 35 (U/L)    Alkaline Phosphatase 93  39 - 117 (U/L)    Total Bilirubin 0.2 (*) 0.3 - 1.2 (mg/dL)    GFR calc non Af Amer >90  >90 (mL/min)    GFR calc Af Amer >90  >90 (mL/min)   LIPASE, BLOOD     Status: Normal   Collection Time   11/30/11  5:56 PM      Component Value Range Comment   Lipase 17  11 - 59 (U/L)   LACTIC ACID, PLASMA     Status: Normal   Collection Time   11/30/11  5:57 PM      Component Value Range Comment   Lactic Acid, Venous 0.8  0.5 - 2.2 (mmol/L)   OCCULT BLOOD, POC DEVICE     Status: Normal   Collection Time   11/30/11  6:51 PM      Component Value Range Comment   Fecal Occult Bld NEGATIVE      Dg Chest Port 1 View  11/30/2011  *RADIOLOGY REPORT*  Clinical Data: Chest and abdominal pain.  PORTABLE CHEST - 1 VIEW  Comparison: 05/14/2011.  Findings: Trachea is midline.  Cardiopericardial silhouette appears enlarged, despite AP technique.  Mild interstitial prominence and indistinctness of the lung bases.  No definite pleural fluid.  IMPRESSION:  1.  Cardiopericardial silhouette appears prominent.  Difficult to exclude pericardial effusion. 2. Question mild basilar dependent pulmonary edema superimposed on scarring.  Original Report Authenticated By: Reyes Ivan, M.D.    Review of Systems  Constitutional: Negative.   HENT: Negative.   Eyes: Negative.   Respiratory: Positive for shortness of breath and wheezing.   Cardiovascular:       Chest pressure.  Gastrointestinal: Positive for nausea, vomiting, abdominal pain and diarrhea.  Genitourinary: Negative.   Musculoskeletal: Negative.   Skin: Negative.   Neurological: Negative.  Endo/Heme/Allergies: Negative.   Psychiatric/Behavioral: Negative.     Blood pressure 130/62, pulse 107, temperature 97.6 F (36.4 C), temperature source Oral, resp. rate 17, SpO2 93.00%. Physical Exam  Constitutional: She is oriented to person, place, and time. She appears  well-developed and well-nourished. No distress.  HENT:  Head: Normocephalic and atraumatic.  Right Ear: External ear normal.  Left Ear: External ear normal.  Nose: Nose normal.  Mouth/Throat: Oropharynx is clear and moist. No oropharyngeal exudate.  Eyes: Conjunctivae are normal. Pupils are equal, round, and reactive to light. Right eye exhibits no discharge. Left eye exhibits no discharge. No scleral icterus.  Neck: Normal range of motion. Neck supple.  Cardiovascular:       Sinus tachycardia.  Respiratory: Effort normal. She has wheezes. She has rales.  GI: Soft. Bowel sounds are normal. She exhibits no distension. There is no tenderness. There is no rebound.  Musculoskeletal: Normal range of motion. She exhibits no edema and no tenderness.  Neurological: She is alert and oriented to person, place, and time.       Moves all extremities.  Skin: Skin is warm and dry. No rash noted. She is not diaphoretic. No erythema.  Psychiatric: Her behavior is normal.     Assessment/Plan #1. Shortness of breath - not clear whether this is just COPD or patient also has added on CHF. Chest x-ray does show congestion and on physical exam shows wheezing and patient's. At this time in addition to her nebulizers I am adding Pulmicort. I'm also giving patient a small does of Lasix. Closely follow intake output and check 2-D echo. Patient was having some cardiac pressure-like symptoms. We will cycle cardiac markers. Hold off IV fluids for now. #2. Nausea vomiting and diarrhea with abdominal discomfort - most likely secondary to antibiotic related. Discontinue antibiotics. Check stool for C. difficile. Patient is complaining of some abdominal discomfort and also bloody stools. We will get a CT abdomen pelvis. Am adding Flagyl and this time. #3. History of hyperlipidemia - continue present medications. #4. Tobacco abuse - advised to quit smoking.  CODE STATUS - full code.  Eduard Clos. 11/30/2011, 10:59  PM

## 2011-11-30 NOTE — ED Notes (Signed)
Pt started having vomiting and diarrhea since may 22 after being placed on clindamycin.  Pt reports mucous  And blood rectally.  Pt reports abdominal pain and swelling.   Pt reports sob adn chest pain and has sat of 82%/RA, placed on 02/2L.

## 2011-12-01 ENCOUNTER — Inpatient Hospital Stay (HOSPITAL_COMMUNITY): Payer: Medicare Other

## 2011-12-01 ENCOUNTER — Encounter (HOSPITAL_COMMUNITY): Payer: Self-pay | Admitting: Radiology

## 2011-12-01 DIAGNOSIS — R112 Nausea with vomiting, unspecified: Secondary | ICD-10-CM

## 2011-12-01 DIAGNOSIS — R0602 Shortness of breath: Secondary | ICD-10-CM

## 2011-12-01 DIAGNOSIS — F17200 Nicotine dependence, unspecified, uncomplicated: Secondary | ICD-10-CM

## 2011-12-01 DIAGNOSIS — R197 Diarrhea, unspecified: Secondary | ICD-10-CM

## 2011-12-01 LAB — CARDIAC PANEL(CRET KIN+CKTOT+MB+TROPI)
CK, MB: 4 ng/mL (ref 0.3–4.0)
CK, MB: 4.2 ng/mL — ABNORMAL HIGH (ref 0.3–4.0)
Relative Index: INVALID (ref 0.0–2.5)
Total CK: 43 U/L (ref 7–177)
Troponin I: <0.3 ng/mL (ref <0.30)

## 2011-12-01 LAB — CBC
HCT: 45.5 % (ref 36.0–46.0)
Hemoglobin: 14 g/dL (ref 12.0–15.0)
RBC: 4.79 MIL/uL (ref 3.87–5.11)
WBC: 8.9 10*3/uL (ref 4.0–10.5)

## 2011-12-01 LAB — COMPREHENSIVE METABOLIC PANEL
BUN: 5 mg/dL — ABNORMAL LOW (ref 6–23)
Calcium: 8.8 mg/dL (ref 8.4–10.5)
Creatinine, Ser: 0.45 mg/dL — ABNORMAL LOW (ref 0.50–1.10)
GFR calc Af Amer: >90 mL/min (ref >90)
Glucose, Bld: 191 mg/dL — ABNORMAL HIGH (ref 70–99)
Sodium: 140 mEq/L (ref 135–145)
Total Protein: 6.7 g/dL (ref 6.0–8.3)

## 2011-12-01 MED ORDER — ALBUTEROL SULFATE (5 MG/ML) 0.5% IN NEBU: 2.5000 mg | INHALATION_SOLUTION | Freq: Four times a day (QID) | RESPIRATORY_TRACT | Status: DC | PRN

## 2011-12-01 MED ORDER — LORAZEPAM 2 MG/ML IJ SOLN
0.5000 mg | Freq: Once | INTRAMUSCULAR | Status: AC
  Administered 2011-12-01: 0.5 mg via INTRAVENOUS
  Filled 2011-12-01 (×2): qty 1

## 2011-12-01 MED ORDER — SODIUM CHLORIDE 0.9 % IV SOLN
INTRAVENOUS | Status: DC
  Administered 2011-12-01 – 2011-12-02 (×3): via INTRAVENOUS

## 2011-12-01 MED ORDER — IOHEXOL 300 MG/ML  SOLN
100.0000 mL | Freq: Once | INTRAMUSCULAR | Status: AC | PRN
  Administered 2011-12-01: 100 mL via INTRAVENOUS

## 2011-12-01 MED ORDER — PREDNISONE 50 MG PO TABS
50.0000 mg | ORAL_TABLET | Freq: Every day | ORAL | Status: DC
  Administered 2011-12-02: 50 mg via ORAL
  Filled 2011-12-01 (×2): qty 1

## 2011-12-01 NOTE — Progress Notes (Signed)
Patient ID: Ashleymarie Granderson, female   DOB: Nov 07, 1955, 56 y.o.   MRN: [id number] PATIENT DETAILS Name: Stephanie Buck Age: 50 y.o. Sex: female Date of Birth: [birth year] Admit Date: 11/30/2011 WUJ:[email or web address], MD, MD  Interim History: 56 yo female with history of asthma and COPD, admitted with 7 days of diarrhea that started after taking clindamycin for dental work.  Subjective: Patient feels weak.  No stools since admission.    Objective: Weight change:   Intake/Output Summary (Last 24 hours) at 12/01/11 1319 Last data filed at 12/01/11 0900  Gross per 24 hour  Intake    580 ml  Output      0 ml  Net    580 ml   Blood pressure 115/67, pulse 94, temperature 98.1 F (36.7 C), temperature source Oral, resp. rate 17, height 5' 7.2" (1.707 m), weight 83 kg (182 lb 15.7 oz), SpO2 97.00%. Filed Vitals:   11/30/11 2215 11/30/11 2323 12/01/11 0240 12/01/11 0521  BP: 130/62 119/74  115/67  Pulse: 107 108  94  Temp:  97.7 F (36.5 C)  98.1 F (36.7 C)  TempSrc:  Oral  Oral  Resp: 17 17  17   Height:  5' 7.2" (1.707 m)    Weight:  81.4 kg (179 lb 7.3 oz)  83 kg (182 lb 15.7 oz)  SpO2: 93% 92% 94% 97%    Physical Exam: General: No acute distress, sleeping.  Awakens to voice.  A&O Lungs: Clear to auscultation bilaterally without wheezes or crackles Cardiovascular: Regular rate and rhythm without murmur gallop or rub normal S1 and S2 Abdomen: Nontender, nondistended, soft, bowel sounds positive, no rebound, no ascites, no appreciable mass Extremities: No significant cyanosis, clubbing, or edema bilateral lower extremities  Basic Metabolic Panel:  Lab 12/01/11 0865 11/30/11 1756  NA 140 142  K 4.7 3.9  CL 96 100  CO2 37* 36*  GLUCOSE 191* 91  BUN 5* 4*  CREATININE 0.45* 0.48*  CALCIUM 8.8 9.3  MG -- --  PHOS -- --   Liver Function Tests:  Lab 12/01/11 0600 11/30/11 1756  AST 34 18  ALT 17 16  ALKPHOS 85 93  BILITOT 0.2* 0.2*  PROT 6.7 6.6  ALBUMIN 3.0* 3.2*    Lab  11/30/11 1756  LIPASE 17  AMYLASE --   CBC:  Lab 12/01/11 0600 11/30/11 1756  WBC 8.9 9.4  NEUTROABS -- 6.6  HGB 14.0 14.7  HCT 45.5 45.7  MCV 95.0 92.5  PLT 338 334   Cardiac Enzymes:  Lab 12/01/11 0947 11/30/11 2349 11/30/11 2224  CKTOTAL 43 51 52  CKMB 4.2* 4.0 4.0  CKMBINDEX -- -- --  TROPONINI <0.30 <0.30 <0.30    Studies/Results:  CT abdomen / pelvis IMPRESSION:  1. 2.8 cm solid hepatic lesion near the IVC. This is an indeterminate finding. Recommend follow-up MRI abdomen without and  with contrast for further evaluation.  2. Mild gallbladder wall thickening may be due to contraction.  3. Bilateral adrenal gland hyperplasia.  4. Diverticulosis of the sigmoid colon.  5. Cystic lesion in the right lower lobe lung with surrounding atelectasis or scarring. A full chest CT may be helpful for further evaluation.  Scheduled Meds:   . albuterol  10 mg/hr Nebulization Once  . ALPRAZolam  0.5 mg Oral BID  . antiseptic oral rinse  15 mL Mouth Rinse q12n4p  . budesonide  0.5 mg Nebulization BID  . chlorhexidine  15 mL Mouth Rinse BID  . furosemide  20 mg Intravenous Once  .  ipratropium  0.5 mg Nebulization Once  . LORazepam  0.5 mg Intravenous Once  . methylPREDNISolone (SOLU-MEDROL) injection  125 mg Intravenous Once  . metroNIDAZOLE  500 mg Oral Q8H  .  morphine injection  4 mg Intravenous Once  . ondansetron (ZOFRAN) IV  4 mg Intravenous Once  . pneumococcal 23 valent vaccine  0.5 mL Intramuscular Tomorrow-1000  . predniSONE  50 mg Oral Q breakfast  . simvastatin  10 mg Oral q1800  . sodium chloride  1,000 mL Intravenous STAT  . sodium chloride  3 mL Intravenous Q12H  . sodium chloride  3 mL Intravenous Q12H  . DISCONTD: albuterol  2.5 mg Nebulization Q6H  . DISCONTD: albuterol  5 mg Nebulization Once  . DISCONTD: ipratropium  0.5 mg Nebulization Q6H   Continuous Infusions:   . sodium chloride 100 mL/hr at 12/01/11 1044   PRN Meds:.acetaminophen,  acetaminophen, albuterol, iohexol, ondansetron (ZOFRAN) IV, ondansetron, oxyCODONE-acetaminophen, DISCONTD: albuterol, DISCONTD: albuterol  Anti-infectives:  Anti-infectives     Start     Dose/Rate Route Frequency Ordered Stop   11/30/11 2359   metroNIDAZOLE (FLAGYL) tablet 500 mg        500 mg Oral 3 times per day 11/30/11 2325            Assessment/Plan: Principal Problem:  *Shortness of breath Active Problems:  Nausea vomiting and diarrhea  Abdominal pain  Hyperlipidemia   1.  Nausea / Vomiting / Diarrhea.  Possible C-diff (given recent clindamycin) versus viral illness.  No stools or vomiting since admission.  Patient feeling hungry requesting solid food will advance diet as tolerated.   Awaiting C-diff PCR.  Patient on Flagyl empirically.  2.  Liver Mass on CT.   LFTs WNL.  Will order follow up MRI.  3.  Dehydration with alkalosis.    Being repleted with IVF.  Likely caused by diarrhea.  4.  Asthma / COPD with Hypoxia 82% Being treated with prednisone, nebulizers, pulmicort, and oxygen.   DVT Prophylaxis:  SCDs. Disposition:  To home when medically ready.    LOS: 1 day   Stephani Police 12/01/2011, 1:19 PM [phone]

## 2011-12-01 NOTE — Care Management Note (Addendum)
    Page 1 of 2   12/03/2011     12:02:47 PM   CARE MANAGEMENT NOTE 12/03/2011  Patient:  Stephanie Buck, Stephanie Buck   Account Number:  [id number]  Date Initiated:  12/01/2011  Documentation initiated by:  Letha Cape  Subjective/Objective Assessment:   dx sob, n/v/d  admit- lives with spouse.  pta independent.     Action/Plan:   Anticipated DC Date:  12/03/2011   Anticipated DC Plan:  HOME/SELF CARE      DC Planning Services  CM consult      PAC Choice  DURABLE MEDICAL EQUIPMENT   Choice offered to / List presented to:  C-1 Patient   DME arranged  OXYGEN  NEBULIZER MACHINE      DME agency  [facility]        Status of service:  Completed, signed off Medicare Important Message given?   (If response is "NO", the following Medicare IM given date fields will be blank) Date Medicare IM given:   Date Additional Medicare IM given:    Discharge Disposition:  HOME/SELF CARE  Per UR Regulation:  Reviewed for med. necessity/level of care/duration of stay  If discussed at Long Length of Stay Meetings, dates discussed:    Comments:  12/03/11 11:16 Letha Cape RN, BSN [phone] patient has medication coverage and transportation. Patient chose [facility] from agency list for home oxygen, referral made to [place].  Patient for dc to home today.   Patient also needs nebulizer machine, Justin notified.  NCM will continue to follow for dc needs.  12/01/11 17:06 Letha Cape RN, BSN [phone] patient lives with spouse,  NCM will continue to follow for dc needs.

## 2011-12-02 ENCOUNTER — Inpatient Hospital Stay (HOSPITAL_COMMUNITY): Payer: Medicare Other

## 2011-12-02 ENCOUNTER — Encounter (HOSPITAL_COMMUNITY): Payer: Self-pay | Admitting: Radiology

## 2011-12-02 DIAGNOSIS — R0602 Shortness of breath: Secondary | ICD-10-CM

## 2011-12-02 DIAGNOSIS — F17200 Nicotine dependence, unspecified, uncomplicated: Secondary | ICD-10-CM

## 2011-12-02 DIAGNOSIS — R112 Nausea with vomiting, unspecified: Secondary | ICD-10-CM

## 2011-12-02 DIAGNOSIS — R197 Diarrhea, unspecified: Secondary | ICD-10-CM

## 2011-12-02 LAB — CLOSTRIDIUM DIFFICILE BY PCR: Toxigenic C. Difficile by PCR: POSITIVE — AB

## 2011-12-02 MED ORDER — ENOXAPARIN SODIUM 40 MG/0.4ML ~~LOC~~ SOLN
40.0000 mg | SUBCUTANEOUS | Status: DC
  Administered 2011-12-02: 40 mg via SUBCUTANEOUS
  Filled 2011-12-02 (×2): qty 0.4

## 2011-12-02 MED ORDER — SODIUM CHLORIDE 0.9 % IV SOLN
INTRAVENOUS | Status: DC
  Administered 2011-12-02 – 2011-12-03 (×2): via INTRAVENOUS

## 2011-12-02 MED ORDER — NICOTINE 14 MG/24HR TD PT24
14.0000 mg | MEDICATED_PATCH | Freq: Every day | TRANSDERMAL | Status: DC
  Administered 2011-12-02 – 2011-12-03 (×2): 14 mg via TRANSDERMAL
  Filled 2011-12-02 (×2): qty 1

## 2011-12-02 MED ORDER — TIOTROPIUM BROMIDE MONOHYDRATE 18 MCG IN CAPS
18.0000 ug | ORAL_CAPSULE | Freq: Every day | RESPIRATORY_TRACT | Status: DC
  Administered 2011-12-03: 18 ug via RESPIRATORY_TRACT
  Filled 2011-12-02: qty 5

## 2011-12-02 MED ORDER — IOHEXOL 350 MG/ML SOLN
90.0000 mL | Freq: Once | INTRAVENOUS | Status: AC | PRN
  Administered 2011-12-02: 90 mL via INTRAVENOUS

## 2011-12-02 MED ORDER — MOXIFLOXACIN HCL 400 MG PO TABS
400.0000 mg | ORAL_TABLET | Freq: Every day | ORAL | Status: DC
  Administered 2011-12-02: 400 mg via ORAL
  Filled 2011-12-02 (×2): qty 1

## 2011-12-02 MED ORDER — SODIUM CHLORIDE 0.9 % IV SOLN
INTRAVENOUS | Status: DC
  Administered 2011-12-02: 13:00:00 via INTRAVENOUS

## 2011-12-02 MED ORDER — PREDNISONE 20 MG PO TABS
20.0000 mg | ORAL_TABLET | Freq: Every day | ORAL | Status: DC
  Administered 2011-12-03: 20 mg via ORAL
  Filled 2011-12-02 (×2): qty 1

## 2011-12-02 MED ORDER — ALPRAZOLAM 0.5 MG PO TABS
0.5000 mg | ORAL_TABLET | Freq: Three times a day (TID) | ORAL | Status: DC
  Administered 2011-12-02 – 2011-12-03 (×4): 0.5 mg via ORAL
  Filled 2011-12-02 (×4): qty 1

## 2011-12-02 NOTE — Progress Notes (Signed)
Pt ambulated with oxygen tank via nasal cannula and oxygen sats were 92% on 3Liters. Ambulated 20 steps without oxygen and oxygen sats were 74%.

## 2011-12-02 NOTE — Progress Notes (Signed)
PATIENT DETAILS Name: Stephanie Buck Age: 56 y.o. Sex: female Date of Birth: 08/17/1955 Admit Date: 11/30/2011 AVW:[id number],[id number], MD, MD  Subjective: Diarrhea resolved   Objective: Vital signs in last 24 hours: Filed Vitals:   12/01/11 2046 12/01/11 2100 12/02/11 0422 12/02/11 1412  BP:  103/65 111/69 130/84  Pulse:  99 88 96  Temp:  97.4 F (36.3 C) 97.3 F (36.3 C) 98.1 F (36.7 C)  TempSrc:  Oral Oral Oral  Resp:  20 18 18   Height:      Weight:   86 kg (189 lb 9.5 oz)   SpO2: 94% 92% 95% 95%    Weight change: 4.6 kg (10 lb 2.3 oz)  Body mass index is 29.52 kg/(m^2).  Intake/Output from previous day:  Intake/Output Summary (Last 24 hours) at 12/02/11 1521 Last data filed at 12/02/11 0900  Gross per 24 hour  Intake   2300 ml  Output      0 ml  Net   2300 ml    PHYSICAL EXAM: Gen Exam: Awake and alert with clear speech.   Neck: Supple, No JVD.   Chest: B/L Clear.   CVS: S1 S2 Regular, no murmurs.  Abdomen: soft, BS +, non tender, non distended.  Extremities: no edema, lower extremities warm to touch. Neurologic: Non Focal.   Skin: No Rash.   Wounds: N/A.    CONSULTS:  None  LAB RESULTS: CBC  Lab 12/01/11 0600 11/30/11 1756  WBC 8.9 9.4  HGB 14.0 14.7  HCT 45.5 45.7  PLT 338 334  MCV 95.0 92.5  MCH 29.2 29.8  MCHC 30.8 32.2  RDW 14.9 14.4  LYMPHSABS -- 1.4  MONOABS -- 1.1*  EOSABS -- 0.2  BASOSABS -- 0.0  BANDABS -- --    Chemistries   Lab 12/01/11 0600 11/30/11 1756  NA 140 142  K 4.7 3.9  CL 96 100  CO2 37* 36*  GLUCOSE 191* 91  BUN 5* 4*  CREATININE 0.45* 0.48*  CALCIUM 8.8 9.3  MG -- --    CBG: No results found for this basename: GLUCAP:5 in the last 168 hours  GFR Estimated Creatinine Clearance: 89.9 ml/min (by C-G formula based on Cr of 0.45).  Coagulation profile No results found for this basename: INR:5,PROTIME:5 in the last 168 hours  Cardiac Enzymes  Lab 12/01/11 1415 12/01/11 0947 11/30/11 2349  CKMB 3.9  4.2* 4.0  TROPONINI <0.30 <0.30 <0.30  MYOGLOBIN -- -- --    No components found with this basename: POCBNP:3 No results found for this basename: DDIMER:2 in the last 72 hours No results found for this basename: HGBA1C:2 in the last 72 hours No results found for this basename: CHOL:2,HDL:2,LDLCALC:2,TRIG:2,CHOLHDL:2,LDLDIRECT:2 in the last 72 hours No results found for this basename: TSH,T4TOTAL,FREET3,T3FREE,THYROIDAB in the last 72 hours No results found for this basename: VITAMINB12:2,FOLATE:2,FERRITIN:2,TIBC:2,IRON:2,RETICCTPCT:2 in the last 72 hours  Basename 11/30/11 1756  LIPASE 17  AMYLASE --    Urine Studies No results found for this basename: UACOL:2,UAPR:2,USPG:2,UPH:2,UTP:2,UGL:2,UKET:2,UBIL:2,UHGB:2,UNIT:2,UROB:2,ULEU:2,UEPI:2,UWBC:2,URBC:2,UBAC:2,CAST:2,CRYS:2,UCOM:2,BILUA:2 in the last 72 hours  MICROBIOLOGY: No results found for this or any previous visit (from the past 240 hour(s)).  RADIOLOGY STUDIES/RESULTS: Mr Abdomen Wo Contrast  12/02/2011  *RADIOLOGY REPORT*  Clinical Data: Mass on abdominal CT.  Nausea vomiting and diarrhea since been placed on antibiotics for dental extraction 1 week ago.  MRI ABDOMEN WITHOUT CONTRAST  Technique:  Multiplanar multisequence MR imaging of the abdomen was performed. No intravenous contrast was administered.  The examination had to be discontinued prior to  completion due to patient claustrophobia.  Comparison:  CT of 12/01/2011.  Findings: Only three sequences could be performed.  Coronal T2 overview images and in and out-of-phase precontrast T1-weighted images.  Normal heart size without pericardial or pleural effusion. Cavitary right lower lobe lung process is identified image 2 of series 8, better visualized on CT of earlier in the day.  No evidence of cirrhosis.  Mild hepatomegaly, 18.9 cm cranial caudal.  Corresponding to the CT abnormality, within the central right lobe of the liver is a 2.5 cm moderately T2 hyperintense, T1  hypointense lesion.  Example image 19 of series 7 and image 13 of series 8.  No other focal liver lesion.  Normal uninfused appearance of the spleen, stomach, pancreas, gallbladder, biliary tract.  Thickening of both adrenal glands without well-defined mass.  Image 14 series 7.  Normal kidneys, without abdominal adenopathy or ascites.  IMPRESSION:  1.  The exam was halted at patient request secondary to claustrophobia. 2. Mild hepatomegaly with redemonstration of a central right liver lobe lesion.  Indeterminate on this unenhanced limited MRI.  Given moderate T2 hyperintensity, this could represent a hemangioma. Multiphase CT may be informative.  Alternatively, presuming the patient cannot undergo complete MRI, this lesion could be followed with ultrasound to confirm ongoing size stability. 3.  Right lung base process which warrants further evaluation with dedicated chest CT.  Original Report Authenticated By: Consuello Bossier, M.D.   Ct Abdomen Pelvis W Contrast  12/01/2011  *RADIOLOGY REPORT*  Clinical Data: Pain, nausea and diarrhea.  CT ABDOMEN AND PELVIS WITH CONTRAST  Technique:  Multidetector CT imaging of the abdomen and pelvis was performed following the standard protocol during bolus administration of intravenous contrast.  Contrast: OMNIPAQUE IOHEXOL 300 MG/ML  SOLN none  Comparison: None  Findings: The lung bases demonstrate areas of atelectasis and probable scarring.  There is a large cystic area in the right lower lobe with an adjacent area of probable compressive atelectasis or scarring.  A dedicated full CT chest may be helpful for further evaluation.  There is a solid appearing mass in the liver near the IVC measuring 2.8 cm.  Recommend further evaluation with MRI abdomen without and with contrast.  This is not have the appearance of a hemangioma.  The gallbladder demonstrates mild wall thickening.  This could be due to contraction.  No obvious gallstones.  No common bile duct dilatation.  The  pancreas is unremarkable.  The spleen is normal in size.  No focal lesions.  Both adrenal glands are slightly enlarged but no discrete nodules.  This is likely the adrenal gland hyperplasia.  The kidneys are unremarkable.  The stomach, duodenum, small bowel and colon are unremarkable. There is moderate diverticulosis of the sigmoid colon but no findings for acute diverticulitis.  No mesenteric or retroperitoneal masses or adenopathy.  The appendix is normal.  The aorta demonstrates moderate atherosclerotic calcifications but no focal aneurysm or dissection.  The uterus is surgically absent.  Both ovaries are still present and appear normal.  No pelvic mass, adenopathy or free pelvic fluid collections.  The bladder appears normal.  No inguinal mass or hernia.  The bony structures are intact.  IMPRESSION:  1.  2.8 cm solid hepatic lesion near the IVC.  This is an indeterminate finding.  Recommend follow-up MRI abdomen without and with contrast for further evaluation. 2.  Mild gallbladder wall thickening may be due to contraction. 3.  Bilateral adrenal gland hyperplasia. 4.  Diverticulosis of the  sigmoid colon. 5.  Cystic lesion in the right lower lobe lung with surrounding atelectasis or scarring.  A full chest CT may be helpful for further evaluation.  Original Report Authenticated By: P. Loralie Champagne, M.D.   Dg Chest Port 1 View  11/30/2011  *RADIOLOGY REPORT*  Clinical Data: Chest and abdominal pain.  PORTABLE CHEST - 1 VIEW  Comparison: 05/14/2011.  Findings: Trachea is midline.  Cardiopericardial silhouette appears enlarged, despite AP technique.  Mild interstitial prominence and indistinctness of the lung bases.  No definite pleural fluid.  IMPRESSION:  1.  Cardiopericardial silhouette appears prominent.  Difficult to exclude pericardial effusion. 2. Question mild basilar dependent pulmonary edema superimposed on scarring.  Original Report Authenticated By: Reyes Ivan, M.D.     MEDICATIONS: Scheduled Meds:   . ALPRAZolam  0.5 mg Oral TID  . budesonide  0.5 mg Nebulization BID  . enoxaparin (LOVENOX) injection  40 mg Subcutaneous Q24H  . LORazepam  0.5 mg Intravenous Once  . metroNIDAZOLE  500 mg Oral Q8H  . nicotine  14 mg Transdermal Daily  . predniSONE  20 mg Oral Q breakfast  . simvastatin  10 mg Oral q1800  . sodium chloride  3 mL Intravenous Q12H  . sodium chloride  3 mL Intravenous Q12H  . DISCONTD: ALPRAZolam  0.5 mg Oral BID  . DISCONTD: predniSONE  50 mg Oral Q breakfast   Continuous Infusions:   . sodium chloride 50 mL/hr at 12/02/11 1359  . DISCONTD: sodium chloride 100 mL/hr at 12/02/11 0603  . DISCONTD: sodium chloride 75 mL/hr at 12/02/11 1306   PRN Meds:.acetaminophen, acetaminophen, albuterol, ondansetron (ZOFRAN) IV, ondansetron, oxyCODONE-acetaminophen  Antibiotics: Anti-infectives     Start     Dose/Rate Route Frequency Ordered Stop   11/30/11 2359   metroNIDAZOLE (FLAGYL) tablet 500 mg        500 mg Oral 3 times per day 11/30/11 2325            Assessment/Plan: Principal Problem:  *Shortness of breath -likely underlying COPD exacerbation -however hypoxia seems somewhat disproportionate to her physical exam, her CT Abdomen and MRI Abd did mention ?cystic changes in the right lung area-therefore will go ahead and get a CT Chest-to better further evaluate the right lung area and given her hypoxia-will do it with contrast-PE protocol -in interim continue with steroids/budenoside nebs/prn albuterol  Diarrhea -resolved -presumed Cdiff -await PCR CDiff -continue with flagyl  Liver Mass -?hemangioma -will see if we can get a Liver Ultrasound while inpatient -Further work-up to be done as outpatient   Hyperlipidemia -continue with statin  Anxiety Disorder -as needed Xanax -currently stable  Tobacco Abuse -Nicotine transdermally -have counseled extensively to quit  Disposition: -remain inpatient  DVT  Prophylaxis: -begin prophylactic Lovenox  Code Status: Full Code  Jeoffrey Massed, MD  [facility] Pager [phone]  If 7PM-7AM, please contact night-coverage [email or web address] Password [email or web address] 12/02/2011, 3:21 PM   LOS: 2 days

## 2011-12-03 DIAGNOSIS — R112 Nausea with vomiting, unspecified: Secondary | ICD-10-CM

## 2011-12-03 DIAGNOSIS — F17200 Nicotine dependence, unspecified, uncomplicated: Secondary | ICD-10-CM

## 2011-12-03 DIAGNOSIS — R197 Diarrhea, unspecified: Secondary | ICD-10-CM

## 2011-12-03 DIAGNOSIS — J449 Chronic obstructive pulmonary disease, unspecified: Secondary | ICD-10-CM

## 2011-12-03 DIAGNOSIS — A0472 Enterocolitis due to Clostridium difficile, not specified as recurrent: Secondary | ICD-10-CM | POA: Diagnosis present

## 2011-12-03 DIAGNOSIS — J439 Emphysema, unspecified: Secondary | ICD-10-CM | POA: Diagnosis present

## 2011-12-03 DIAGNOSIS — R0602 Shortness of breath: Secondary | ICD-10-CM

## 2011-12-03 LAB — CBC
Hemoglobin: 13.1 g/dL (ref 12.0–15.0)
MCH: 29 pg (ref 26.0–34.0)
MCHC: 30.6 g/dL (ref 30.0–36.0)
MCV: 94.7 fL (ref 78.0–100.0)
Platelets: 315 10*3/uL (ref 150–400)
RBC: 4.52 MIL/uL (ref 3.87–5.11)

## 2011-12-03 LAB — BASIC METABOLIC PANEL
CO2: 35 mEq/L — ABNORMAL HIGH (ref 19–32)
Calcium: 8.9 mg/dL (ref 8.4–10.5)
Creatinine, Ser: 0.51 mg/dL (ref 0.50–1.10)
GFR calc non Af Amer: >90 mL/min (ref >90)
Glucose, Bld: 154 mg/dL — ABNORMAL HIGH (ref 70–99)

## 2011-12-03 MED ORDER — TIOTROPIUM BROMIDE MONOHYDRATE 18 MCG IN CAPS: 18.0000 ug | ORAL_CAPSULE | Freq: Every day | RESPIRATORY_TRACT | Status: DC

## 2011-12-03 MED ORDER — ALBUTEROL SULFATE (5 MG/ML) 0.5% IN NEBU: 2.5000 mg | INHALATION_SOLUTION | Freq: Four times a day (QID) | RESPIRATORY_TRACT | Status: DC | PRN

## 2011-12-03 MED ORDER — METRONIDAZOLE 500 MG PO TABS: 500.0000 mg | ORAL_TABLET | Freq: Three times a day (TID) | ORAL | Status: AC

## 2011-12-03 MED ORDER — ALPRAZOLAM 0.5 MG PO TABS: 0.5000 mg | ORAL_TABLET | Freq: Two times a day (BID) | ORAL | Status: DC

## 2011-12-03 MED ORDER — BUDESONIDE 0.5 MG/2ML IN SUSP: 0.5000 mg | Freq: Two times a day (BID) | RESPIRATORY_TRACT | Status: DC

## 2011-12-03 MED ORDER — OXYCODONE-ACETAMINOPHEN 5-325 MG PO TABS: 1.0000 | ORAL_TABLET | ORAL | Status: DC | PRN

## 2011-12-03 MED ORDER — ONDANSETRON HCL 4 MG PO TABS: 4.0000 mg | ORAL_TABLET | Freq: Four times a day (QID) | ORAL | Status: AC | PRN

## 2011-12-03 MED ORDER — PREDNISONE 5 MG PO TABS: 10.0000 mg | ORAL_TABLET | Freq: Every day | ORAL | Status: AC

## 2011-12-03 MED ORDER — NICOTINE 14 MG/24HR TD PT24: 1.0000 | MEDICATED_PATCH | Freq: Every day | TRANSDERMAL | Status: AC

## 2011-12-03 NOTE — Progress Notes (Signed)
SATURATION QUALIFICATIONS:  Patient Saturations on Room Air at Rest = 82%  Patient Saturations on Room Air while Ambulating = 72%  Patient Saturations on 3 Liters of oxygen while Ambulating = 92%

## 2011-12-03 NOTE — Discharge Summary (Signed)
I have seen and examined the patient, I agree with the assessment and plan as outlined above. Although hypoxic, she is not acutely short of breath, will need oxygen on discharge. CT angio-negative for PE.

## 2011-12-03 NOTE — Progress Notes (Signed)
Pt measured 92% O2 sats at rest on 3L O2 [place]. After 10 ft ambulation on RA, O2 sats dropped to 72%. Sats returned to 92% after 2 minutes at rest on 3L O2 [place].

## 2011-12-03 NOTE — Progress Notes (Signed)
Patient discharge teaching given, including activity, diet, follow-up appoints, and medications. Patient verbalized understanding of all discharge instructions. IV access was d/c'd. Vitals are stable. Skin is intact except as charted in most recent assessments. Pt to be escorted out by NT, to be driven home by family. 

## 2011-12-03 NOTE — Discharge Instructions (Signed)
ABSOLUTELY NO SMOKING.

## 2011-12-03 NOTE — Discharge Summary (Signed)
Patient ID: Tanish Sinkler MRN: [id number] DOB/AGE: Sep 25, 1955 56 y.o.  Admit date: 11/30/2011 Discharge date: 12/03/2011  Primary Care Physician:  Sheila Oats, MD, MD  Discharge Diagnoses:   Principal Problem:  C. difficile diarrhea  COPD with respiratory failure, acute  Bleb, lung  Tobacco abuse  Nausea vomiting and diarrhea Abdominal pain Hyperlipidemia    Medication List  As of 12/03/2011 11:41 AM   TAKE these medications         albuterol (5 MG/ML) 0.5% nebulizer solution   Commonly known as: PROVENTIL   Take 0.5 mLs (2.5 mg total) by nebulization every 6 (six) hours as needed for wheezing or shortness of breath.      albuterol 108 (90 BASE) MCG/ACT inhaler   Commonly known as: PROVENTIL HFA;VENTOLIN HFA   Inhale 2 puffs into the lungs every 6 (six) hours as needed. For shortness of breath.      ALPRAZolam 0.5 MG tablet   Commonly known as: XANAX   Take 1 tablet (0.5 mg total) by mouth 2 (two) times daily.      budesonide 0.5 MG/2ML nebulizer solution   Commonly known as: PULMICORT   Take 2 mLs (0.5 mg total) by nebulization 2 (two) times daily.      dicyclomine 10 MG capsule   Commonly known as: BENTYL   Take 10 mg by mouth 3 (three) times daily before meals.      ibuprofen 200 MG tablet   Commonly known as: ADVIL,MOTRIN   Take 200-800 mg by mouth every 6 (six) hours as needed. For pain.      lovastatin 20 MG tablet   Commonly known as: MEVACOR   Take 20 mg by mouth at bedtime.      metroNIDAZOLE 500 MG tablet   Commonly known as: FLAGYL   Take 1 tablet (500 mg total) by mouth every 8 (eight) hours.      nicotine 14 mg/24hr patch   Commonly known as: NICODERM CQ - dosed in mg/24 hours   Place 1 patch onto the skin daily.      ondansetron 4 MG tablet   Commonly known as: ZOFRAN   Take 1 tablet (4 mg total) by mouth every 6 (six) hours as needed for nausea.      oxyCODONE-acetaminophen 5-325 MG per tablet   Commonly known as: PERCOCET   Take 1  tablet by mouth every 4 (four) hours as needed. For pain.      predniSONE 5 MG tablet   Commonly known as: DELTASONE   Take 2 tablets (10 mg total) by mouth daily with breakfast. Taper 2 tablets tomorrow morning. Then 1 tablet on Saturday morning and Sunday morning.  Then discontinue.      psyllium 95 % Pack   Commonly known as: HYDROCIL/METAMUCIL   Take 1 packet by mouth daily.      tiotropium 18 MCG inhalation capsule   Commonly known as: SPIRIVA   Place 1 capsule (18 mcg total) into inhaler and inhale daily.             Brief H and P: From the admission note:  56 year-old female with known history of COPD, ongoing tobacco abuse, hyperlipidemia has had a recent dental extraction one week ago and was placed on clindamycin. Since then patient has been having some diarrhea. Which gradually worsened and last 2-3 days has been having nausea vomiting abdominal discomfort. She has been having multiple episodes of nausea vomiting and diarrhea. And her abdominal discomfort is generalized. She  also noticed some blood in the stools. Denies any fever chills. Last 2-3 days patient also noticed that she has been getting short of breath with wheezing and exertional symptoms. She also has been having chest congestion. Denies any productive cough. In the ER despite multiple nebulizer doses patient has been feeling mildly short of breath. Patient has been admitted for further workup.  1.  C difficile diarrhea.  The patient was admitted after 7 days of perfuse diarrhea.  She was dehydrated and alkalotic.  She did not have any further diarrhea and it took 2 days to get a stool sample.  She was C-diff PCR positive and has been placed on oral Flagyl TID.  With 48 hours of IV fluids she improved tremendously and her bmet normalized.  2.  COPD with Acute respiratory failure and hypoxia.  Ms. Charlie was noted to have increased difficulty breathing.  Out of concern for PE a CT Angio was ordered.  Fortunately she  did not have a PE but was noted to have blebs (possibly infected) in her lungs.  Oxygen saturations dropped to 72% resting but returned to the 90s with 3 liters nasal canula.  She will be discharged home on oxygen, nebulizers prn,  spiriva, budesonide, and a quick prednisone taper (prednisone makes her feel very anxious).  She has an appointment to see Dr. Shelle Iron on 6/28 to follow up on her COPD with lung blebs.  3.  Liver Hemangioma.  Patient was found to have a 2 cm liver mass on CT scan.  She underwent MRI to further evaluate the abnormality but the study was technically poor.  Follow up U/S of the liver revealed the mass had features with hemangioma and was likely benign.  The patient's LFTs are within normal limits.  Ms. Moor is feeling much improved.  She is not having diarrhea.  She is eating well, hydrated and able to ambulate without difficulty on oxygen.  Physical Exam on Discharge: General: Alert, awake, oriented x3, in no acute distress.  Appears well. HEENT: No bruits, no goiter. Heart: Regular rate and rhythm, without murmurs, rubs, gallops. Lungs: slight wheeze, no accessory muscle use. Abdomen: Soft, nontender, nondistended, positive bowel sounds. Extremities: No clubbing cyanosis or edema with positive pedal pulses. Neuro: Grossly intact, nonfocal.  Filed Vitals:   12/03/11 0601 12/03/11 0843 12/03/11 1046 12/03/11 1049  BP: 108/69     Pulse: 92     Temp: 98 F (36.7 C)     TempSrc: Oral     Resp: 20     Height:      Weight: 86.2 kg (190 lb 0.6 oz)     SpO2: 95% 91% 82% 92%     Intake/Output Summary (Last 24 hours) at 12/03/11 1141 Last data filed at 12/03/11 0548  Gross per 24 hour  Intake 980.83 ml  Output      0 ml  Net 980.83 ml    Basic Metabolic Panel:  Lab 12/03/11 1610 12/01/11 0600  NA 140 140  K 3.5 4.7  CL 98 96  CO2 35* 37*  GLUCOSE 154* 191*  BUN 10 5*  CREATININE 0.51 0.45*  CALCIUM 8.9 8.8  MG -- --  PHOS -- --   Liver Function  Tests:  Lab 12/01/11 0600 11/30/11 1756  AST 34 18  ALT 17 16  ALKPHOS 85 93  BILITOT 0.2* 0.2*  PROT 6.7 6.6  ALBUMIN 3.0* 3.2*    Lab 11/30/11 1756  LIPASE 17  AMYLASE --  CBC:  Lab 12/03/11 0545 12/01/11 0600 11/30/11 1756  WBC 9.9 8.9 --  NEUTROABS -- -- 6.6  HGB 13.1 14.0 --  HCT 42.8 45.5 --  MCV 94.7 95.0 --  PLT 315 338 --   Cardiac Enzymes:  Lab 12/01/11 1415 12/01/11 0947 11/30/11 2349  CKTOTAL 43 43 51  CKMB 3.9 4.2* 4.0  CKMBINDEX -- -- --  TROPONINI <0.30 <0.30 <0.30    Micro Results:  Results for orders placed during the hospital encounter of 11/30/11  CLOSTRIDIUM DIFFICILE BY PCR     Status: Abnormal   Collection Time   12/02/11  1:37 PM      Component Value Range Status Comment   C difficile by pcr POSITIVE (*) NEGATIVE  Final        Other Pertinent Lab Results:    Significant Diagnostic Studies:  Ct Angio Chest W/cm &/or Wo Cm  12/02/2011  *RADIOLOGY REPORT*  Clinical Data: Chest pain.  Shortness of breath.  CT ANGIOGRAPHY CHEST  Technique:  Multidetector CT imaging of the chest using the standard protocol during bolus administration of intravenous contrast. Multiplanar reconstructed images including MIPs were obtained and reviewed to evaluate the vascular anatomy.  Contrast: 90mL OMNIPAQUE IOHEXOL 350 MG/ML SOLN  Comparison: None.  Findings: Contrast opacification of the pulmonary arteries is very good.  Respiratory motion blurs images throughout the lungs. Overall, the study is of good diagnostic quality.  No filling defects within either main pulmonary artery or their branches in either lung to suggest pulmonary embolism.  Heart mildly enlarged with left ventricular predominance.  Moderate LAD coronary artery calcification.  No pericardial effusion.  Moderate atherosclerosis involving the thoracic aorta without evidence of aneurysm or dissection.  Emphysematous changes throughout both lungs, including large blebs in the inferior right upper lobe  which have air fluid levels. Linear atelectasis in the posterior lower lobes bilaterally.  No confluent airspace consolidation.  Very small bilateral pleural effusions.  Central airways patent with moderate bronchial wall thickening.  Mildly enlarged right hilar lymph nodes measuring approximately 2.5 x 2.1 cm (series 5, image 36). Normal sized mediastinal, left hilar, and bilateral axillary lymph nodes.  Visualized thyroid gland unremarkable.  Enlargement of both adrenal glands without nodularity.  Residual barium in the distal transverse colon.  Visualized upper abdomen otherwise unremarkable for the early arterial phase enhancement. Bone window images unremarkable.  IMPRESSION:  1.  No evidence of pulmonary embolism. 2.  COPD/emphysema, with infected large blebs in the inferior right upper lobe. 3.  Small bilateral pleural effusions.  Atelectasis deep in the lower lobes bilaterally. 4.  Cardiomegaly.  Moderate LAD coronary atherosclerosis. 5.  Bilateral adrenal hyperplasia.  Original Report Authenticated By: Arnell Sieving, M.D.   Mr Abdomen Wo Contrast  12/02/2011  *RADIOLOGY REPORT*  Clinical Data: Mass on abdominal CT.  Nausea vomiting and diarrhea since been placed on antibiotics for dental extraction 1 week ago.  MRI ABDOMEN WITHOUT CONTRAST  Technique:  Multiplanar multisequence MR imaging of the abdomen was performed. No intravenous contrast was administered.  The examination had to be discontinued prior to completion due to patient claustrophobia.  Comparison:  CT of 12/01/2011.  Findings: Only three sequences could be performed.  Coronal T2 overview images and in and out-of-phase precontrast T1-weighted images.  Normal heart size without pericardial or pleural effusion. Cavitary right lower lobe lung process is identified image 2 of series 8, better visualized on CT of earlier in the day.  No evidence of cirrhosis.  Mild hepatomegaly,  18.9 cm cranial caudal.  Corresponding to the CT abnormality,  within the central right lobe of the liver is a 2.5 cm moderately T2 hyperintense, T1 hypointense lesion.  Example image 19 of series 7 and image 13 of series 8.  No other focal liver lesion.  Normal uninfused appearance of the spleen, stomach, pancreas, gallbladder, biliary tract.  Thickening of both adrenal glands without well-defined mass.  Image 14 series 7.  Normal kidneys, without abdominal adenopathy or ascites.  IMPRESSION:  1.  The exam was halted at patient request secondary to claustrophobia. 2. Mild hepatomegaly with redemonstration of a central right liver lobe lesion.  Indeterminate on this unenhanced limited MRI.  Given moderate T2 hyperintensity, this could represent a hemangioma. Multiphase CT may be informative.  Alternatively, presuming the patient cannot undergo complete MRI, this lesion could be followed with ultrasound to confirm ongoing size stability. 3.  Right lung base process which warrants further evaluation with dedicated chest CT.  Original Report Authenticated By: Consuello Bossier, M.D.   US Abdomen Complete  12/02/2011  *RADIOLOGY REPORT*  Clinical Data:  Abnormal CT and MRI scan.  Solid hepatic mass.  COMPLETE ABDOMINAL ULTRASOUND  Comparison:  MRI of the abdomen 12/01/2011.  CT of the abdomen and pelvis 12/01/2011.  Findings:  Gallbladder:  No shadowing gallstones or echogenic sludge.  No gallbladder wall thickening or pericholecystic fluid.  No sonographic Murphy's sign according to the ultrasound technologist. Wall thickness is 1.6 cm, within normal limits.  Common bile duct:  Normal in caliber. No biliary ductal dilation. The maximal diameters 1.7 mm, within normal limits.  Liver:  A focal homogeneous hyperechoic lesion measures 2.9 x 2.6 x 2.3 cm.  This corresponds with the previously seen lesion and is compatible with a benign hemangioma.  IVC:  Appears normal.  Pancreas:  No focal abnormality seen.  Spleen:  Normal size and echotexture without focal parenchymal abnormality.   The maximal length is 6.7 cm, within normal limits.  Right Kidney:  No hydronephrosis.  Well-preserved cortex.  Normal size and parenchymal echotexture without focal abnormalities. The maximal length is 11.0 cm, within normal limits.  Left Kidney:  No hydronephrosis.  Well-preserved cortex.  Normal size and parenchymal echotexture without focal abnormalities. The maximal length is 11.3 cm, within normal limits.  Abdominal aorta:  No aneurysm identified.  IMPRESSION: Negative abdominal ultrasound.  Original Report Authenticated By: Jamesetta Orleans. MATTERN, M.D.   Ct Abdomen Pelvis W Contrast  12/01/2011  *RADIOLOGY REPORT*  Clinical Data: Pain, nausea and diarrhea.  CT ABDOMEN AND PELVIS WITH CONTRAST  Technique:  Multidetector CT imaging of the abdomen and pelvis was performed following the standard protocol during bolus administration of intravenous contrast.  Contrast: OMNIPAQUE IOHEXOL 300 MG/ML  SOLN none  Comparison: None  Findings: The lung bases demonstrate areas of atelectasis and probable scarring.  There is a large cystic area in the right lower lobe with an adjacent area of probable compressive atelectasis or scarring.  A dedicated full CT chest may be helpful for further evaluation.  There is a solid appearing mass in the liver near the IVC measuring 2.8 cm.  Recommend further evaluation with MRI abdomen without and with contrast.  This is not have the appearance of a hemangioma.  The gallbladder demonstrates mild wall thickening.  This could be due to contraction.  No obvious gallstones.  No common bile duct dilatation.  The pancreas is unremarkable.  The spleen is normal in size.  No focal lesions.  Both adrenal glands are slightly enlarged but no discrete nodules.  This is likely the adrenal gland hyperplasia.  The kidneys are unremarkable.  The stomach, duodenum, small bowel and colon are unremarkable. There is moderate diverticulosis of the sigmoid colon but no findings for acute  diverticulitis.  No mesenteric or retroperitoneal masses or adenopathy.  The appendix is normal.  The aorta demonstrates moderate atherosclerotic calcifications but no focal aneurysm or dissection.  The uterus is surgically absent.  Both ovaries are still present and appear normal.  No pelvic mass, adenopathy or free pelvic fluid collections.  The bladder appears normal.  No inguinal mass or hernia.  The bony structures are intact.  IMPRESSION:  1.  2.8 cm solid hepatic lesion near the IVC.  This is an indeterminate finding.  Recommend follow-up MRI abdomen without and with contrast for further evaluation. 2.  Mild gallbladder wall thickening may be due to contraction. 3.  Bilateral adrenal gland hyperplasia. 4.  Diverticulosis of the sigmoid colon. 5.  Cystic lesion in the right lower lobe lung with surrounding atelectasis or scarring.  A full chest CT may be helpful for further evaluation.  Original Report Authenticated By: P. Loralie Champagne, M.D.   Dg Chest Port 1 View  11/30/2011  *RADIOLOGY REPORT*  Clinical Data: Chest and abdominal pain.  PORTABLE CHEST - 1 VIEW  Comparison: 05/14/2011.  Findings: Trachea is midline.  Cardiopericardial silhouette appears enlarged, despite AP technique.  Mild interstitial prominence and indistinctness of the lung bases.  No definite pleural fluid.  IMPRESSION:  1.  Cardiopericardial silhouette appears prominent.  Difficult to exclude pericardial effusion. 2. Question mild basilar dependent pulmonary edema superimposed on scarring.  Original Report Authenticated By: Reyes Ivan, M.D.      Disposition and Follow-up: Stable for discharge to home on oxygen with nebulizers and antibiotics for c-diff.  Follow up as listed below.  Discharge Orders    Future Appointments: Provider: Department: Dept Phone: Center:   12/25/2011 11:30 AM Barbaraann Share, MD [facility] [phone] None     Future Orders Please Complete By Expires   Diet - low sodium heart healthy       Increase activity slowly      Discharge instructions      Comments:   Follow up with Dr. Eula Listen for your Echocardiogram results.    Discharged home on oxygen.  For hypoxia of 72%  To see Dr. Shelle Iron at the end of the month.     Follow-up Information    Follow up with Barbaraann Share, MD on 12/25/2011. (ARRIVE AT 11:15 FOR 11:30 APPOINTMENT)    Contact information:   [address] [employer] [place] [address] [phone]       Schedule an appointment as soon as possible for a visit with [facility], MD. (call to schedule appointment - to be seen in  1 week.)    Contact information:   [address] [place] [address] [phone]           Time spent on Discharge: 40 min.  SignedStephani Police 12/03/2011, 11:41 AM [phone]

## 2011-12-03 NOTE — Progress Notes (Signed)
  Echocardiogram 2D Echocardiogram has been performed.  Cathie Beams Deneen 12/03/2011, 9:45 AM

## 2011-12-25 ENCOUNTER — Ambulatory Visit (INDEPENDENT_AMBULATORY_CARE_PROVIDER_SITE_OTHER): Payer: Medicare Other | Admitting: Pulmonary Disease

## 2011-12-25 ENCOUNTER — Encounter: Payer: Self-pay | Admitting: Pulmonary Disease

## 2011-12-25 VITALS — BP 110/82 | HR 94 | Temp 97.8°F | Ht 66.0 in | Wt 174.0 lb

## 2011-12-25 DIAGNOSIS — R0602 Shortness of breath: Secondary | ICD-10-CM

## 2011-12-25 DIAGNOSIS — R9389 Abnormal findings on diagnostic imaging of other specified body structures: Secondary | ICD-10-CM

## 2011-12-25 MED ORDER — BUDESONIDE-FORMOTEROL FUMARATE 160-4.5 MCG/ACT IN AERO: 2.0000 | INHALATION_SPRAY | Freq: Two times a day (BID) | RESPIRATORY_TRACT | Status: DC

## 2011-12-25 NOTE — Patient Instructions (Addendum)
Stop all of your nebulizer medications except albuterol.  You are not to use this except for emergencies. Can use albuterol inhaler as needed when away from home if an emergency. Stay on spiriva, one inhalation each am. Start symbicort 160/4.5 2 inhalations am and pm everyday.  Rinse mouth well after using.  Will give you a prescription for this as well.  Stay off cigarettes. This is by far the most important of all of your treatments.  Would like to see you back in 2mos, and will evaluate whether you still need oxygen.  Work on some type of exercise and weight loss. Will schedule for breathing studies, and will call you with the results.

## 2011-12-25 NOTE — Assessment & Plan Note (Signed)
The patient had severe dyspnea on exertion that I suspect is related to COPD.  However, she will need full PFTs for evaluation.  At this point, I would like to optimize her bronchodilator regimen, and have also stressed to her the importance of staying off cigarettes.  I would like for her to stay on her oxygen for a few more months, and then we will reevaluate.  I've also stressed to her the importance of weight loss and conditioning.  I will call her with the results of her breathing studies.

## 2011-12-25 NOTE — Progress Notes (Signed)
  Subjective:    Patient ID: Stephanie Buck, female    DOB: 12-Jul-1955, 56 y.o.   MRN: [id number]  HPI The patient is a 56 year old female who I been asked to see for COPD and an abnormal chest CT.  She was recently admitted to the hospital with pseudomembranous colitis after taking clindamycin, and also felt to have a COPD exacerbation.  She has a history of smoking one to 2 packs per day for 41 years, and has noted progressive dyspnea on exertion over the last few years.  She has especially seen worsening symptoms the last 3-6 months.  She was treated with antibiotics and prednisone, as well as bronchodilators while in the hospital.  She has been discharged on various inhaled and nebulized bronchodilators.  The patient states that she has not smoked since the hospitalization.  She underwent a CT scan of her chest while in the hospital to rule out a pulmonary embolus, and none was found.  She did have emphysematous changes, mild right hilar lymphadenopathy, as well as a large bullous in the right upper lobe with a very tiny fluid level.  Currently, the patient describes less than 2 blocks dyspnea on exertion at a moderate pace, and she will also get winded bringing groceries in from the car or doing light housework.  Her cough has totally resolved since stopping smoking, and she is producing no mucus currently.  She was discharged on supplemental oxygen for hypoxemia.  The patient has never had pulmonary function test for documentation.  She denies any significant lower extremity edema.   Review of Systems  Constitutional: Negative for fever and unexpected weight change.  HENT: Positive for dental problem. Negative for ear pain, nosebleeds, congestion, sore throat, rhinorrhea, sneezing, trouble swallowing, postnasal drip and sinus pressure.   Eyes: Negative for redness and itching.  Respiratory: Positive for shortness of breath. Negative for cough, chest tightness and wheezing.   Cardiovascular: Positive  for chest pain. Negative for palpitations and leg swelling.  Gastrointestinal: Positive for abdominal pain. Negative for nausea and vomiting.  Genitourinary: Negative for dysuria.  Musculoskeletal: Positive for arthralgias. Negative for joint swelling.  Skin: Negative for rash.  Neurological: Positive for headaches.  Hematological: Does not bruise/bleed easily.  Psychiatric/Behavioral: Negative for dysphoric mood. The patient is nervous/anxious.   All other systems reviewed and are negative.       Objective:   Physical Exam Constitutional:  Well developed, no acute distress  HENT:  Nares patent without discharge  Oropharynx without exudate, palate and uvula are normal  Eyes:  Perrla, eomi, no scleral icterus  Neck:  No JVD, no TMG  Cardiovascular:  Normal rate, regular rhythm, no rubs or gallops.  No murmurs        Intact distal pulses  Pulmonary :  decreased breath sounds, no stridor or respiratory distress   No rales, rhonchi, or wheezing  Abdominal:  Soft, nondistended, bowel sounds present.  No tenderness noted.   Musculoskeletal:  No lower extremity edema noted.  Lymph Nodes:  No cervical lymphadenopathy noted  Skin:  No cyanosis noted  Neurologic:  Alert, appropriate, moves all 4 extremities without obvious deficit.         Assessment & Plan:

## 2011-12-25 NOTE — Assessment & Plan Note (Signed)
The patient has a large bulla in the right upper lobe, and appears to be fluid containing.  It is unclear whether this is sterile or infected fluid at this point.  She does have right hilar lymphadenopathy, but no other.  I suspect this is reactive.  Will need to have a followup scan in 3-4 months to further evaluate.

## 2011-12-28 ENCOUNTER — Telehealth: Payer: Self-pay | Admitting: Pulmonary Disease

## 2011-12-28 MED ORDER — TIOTROPIUM BROMIDE MONOHYDRATE 18 MCG IN CAPS: 18.0000 ug | ORAL_CAPSULE | Freq: Every day | RESPIRATORY_TRACT | Status: DC

## 2011-12-28 NOTE — Telephone Encounter (Signed)
RX has been sent and pt aware. Nothing further was needed 

## 2012-01-04 ENCOUNTER — Ambulatory Visit (INDEPENDENT_AMBULATORY_CARE_PROVIDER_SITE_OTHER): Payer: Medicare Other | Admitting: Pulmonary Disease

## 2012-01-04 ENCOUNTER — Telehealth: Payer: Self-pay | Admitting: Pulmonary Disease

## 2012-01-04 DIAGNOSIS — R0602 Shortness of breath: Secondary | ICD-10-CM

## 2012-01-04 DIAGNOSIS — R9389 Abnormal findings on diagnostic imaging of other specified body structures: Secondary | ICD-10-CM

## 2012-01-04 DIAGNOSIS — J449 Chronic obstructive pulmonary disease, unspecified: Secondary | ICD-10-CM

## 2012-01-04 LAB — PULMONARY FUNCTION TEST

## 2012-01-04 NOTE — Progress Notes (Signed)
PFT done today. 

## 2012-01-04 NOTE — Telephone Encounter (Signed)
Stephanie Buck, please let pt know that she has moderate emphysema by her breathing studies, and would like to stick with same plan.  Remind her that smoking cessation is the most important thing that she can do.

## 2012-01-11 NOTE — Telephone Encounter (Signed)
Pt aware of PFT results per Dr. Shelle Iron and verbalized understanding. She did question whether she should continue on the oxygen and per last OV note oxygen use willl be discussed at the next OV.

## 2012-01-27 ENCOUNTER — Other Ambulatory Visit: Payer: Self-pay | Admitting: Internal Medicine

## 2012-01-27 DIAGNOSIS — Z1231 Encounter for screening mammogram for malignant neoplasm of breast: Secondary | ICD-10-CM

## 2012-02-22 ENCOUNTER — Ambulatory Visit
Admission: RE | Admit: 2012-02-22 | Discharge: 2012-02-22 | Disposition: A | Discharge status: Home / Self Care | Payer: Medicare Other | Source: Ambulatory Visit | Attending: Internal Medicine | Admitting: Internal Medicine

## 2012-02-22 DIAGNOSIS — Z1231 Encounter for screening mammogram for malignant neoplasm of breast: Secondary | ICD-10-CM

## 2012-02-24 ENCOUNTER — Encounter: Payer: Self-pay | Admitting: Pulmonary Disease

## 2012-02-24 ENCOUNTER — Ambulatory Visit (INDEPENDENT_AMBULATORY_CARE_PROVIDER_SITE_OTHER): Payer: Medicare Other | Admitting: Pulmonary Disease

## 2012-02-24 VITALS — BP 100/70 | HR 88 | Temp 97.9°F | Ht 66.0 in | Wt 192.8 lb

## 2012-02-24 DIAGNOSIS — R9389 Abnormal findings on diagnostic imaging of other specified body structures: Secondary | ICD-10-CM

## 2012-02-24 DIAGNOSIS — J449 Chronic obstructive pulmonary disease, unspecified: Secondary | ICD-10-CM

## 2012-02-24 NOTE — Progress Notes (Signed)
  Subjective:    Patient ID: Stephanie Buck, female    DOB: [birth year], 56 y.o.   MRN: [id number]  HPI The patient comes in today for followup of her known COPD.  She has been compliant with her oxygen and bronchodilators, and continues to stay away from cigarettes.  She feels that she is much improved, and her cough has totally resolved.  She states that she feels better than she has in years.  She denies any chest congestion or purulence.   Review of Systems  Constitutional: Positive for unexpected weight change. Negative for fever.  HENT: Positive for dental problem. Negative for ear pain, nosebleeds, congestion, sore throat, rhinorrhea, sneezing, trouble swallowing, postnasal drip and sinus pressure.   Eyes: Negative for redness and itching.  Respiratory: Negative for cough, chest tightness, shortness of breath and wheezing.   Cardiovascular: Positive for leg swelling. Negative for palpitations.  Gastrointestinal: Negative for nausea and vomiting.  Genitourinary: Negative for dysuria.  Musculoskeletal: Negative for joint swelling.  Skin: Negative for rash.  Neurological: Positive for headaches.  Hematological: Bruises/bleeds easily.  Psychiatric/Behavioral: Positive for dysphoric mood. The patient is nervous/anxious.        Objective:   Physical Exam Overweight female in no acute distress Nose without purulent discharge noted Chest totally clear to auscultation, no wheezing Cardiac exam is regular rate and rhythm Lower extremities without edema, no cyanosis Alert and oriented, moves all 4 extremities.       Assessment & Plan:

## 2012-02-24 NOTE — Patient Instructions (Addendum)
Will discontinue oxygen. Will schedule for followup ct chest the end of SEPT/early NOV. Continue on your current inhalers.  If doing well at next visit, will consider discontinuing one of them.   Continue to stay off cigarettes.  You are doing great! followup with me in 3mos.  If doing well, will spread out further.

## 2012-02-24 NOTE — Assessment & Plan Note (Signed)
The patient is doing very well on her current bronchodilator regimen, and has been successful with total smoking cessation.  I will test her oxygen saturations on room air with activity today, and hopefully we can discontinue her oxygen.  I would also like to discontinue one of her inhalers, but will wait until the next visit in approximately 3 months.  I have also encouraged her to work on weight loss and some type of exercise program.

## 2012-02-24 NOTE — Assessment & Plan Note (Signed)
The patient is due for a followup CT, and I will schedule this for the end of September.

## 2012-03-24 ENCOUNTER — Ambulatory Visit (INDEPENDENT_AMBULATORY_CARE_PROVIDER_SITE_OTHER)
Admission: RE | Admit: 2012-03-24 | Discharge: 2012-03-24 | Disposition: A | Discharge status: Home / Self Care | Payer: Medicare Other | Source: Ambulatory Visit | Attending: Pulmonary Disease | Admitting: Pulmonary Disease

## 2012-03-24 DIAGNOSIS — R9389 Abnormal findings on diagnostic imaging of other specified body structures: Secondary | ICD-10-CM

## 2012-03-24 MED ORDER — IOHEXOL 300 MG/ML  SOLN
80.0000 mL | Freq: Once | INTRAMUSCULAR | Status: AC | PRN
  Administered 2012-03-24: 80 mL via INTRAVENOUS

## 2012-05-27 ENCOUNTER — Ambulatory Visit (INDEPENDENT_AMBULATORY_CARE_PROVIDER_SITE_OTHER): Payer: Medicare Other | Admitting: Pulmonary Disease

## 2012-05-27 ENCOUNTER — Encounter: Payer: Self-pay | Admitting: Pulmonary Disease

## 2012-05-27 VITALS — BP 110/70 | HR 66 | Temp 97.5°F | Ht 66.0 in | Wt 193.4 lb

## 2012-05-27 DIAGNOSIS — J449 Chronic obstructive pulmonary disease, unspecified: Secondary | ICD-10-CM

## 2012-05-27 NOTE — Assessment & Plan Note (Signed)
The patient is doing very well from a COPD standpoint.  She is also working on an exercise program and weight loss.  I have asked her to continue this, and would like to see if she can get by with only one bronchodilator.  I have asked her to stop one of her maintenance medications to see how well she does.  I've also asked her to work on her nasal hygiene regimen, and call us if this is not getting better.

## 2012-05-27 NOTE — Patient Instructions (Addendum)
Try stopping either symbicort or spiriva, and see how you do.  If you worsen, get back on the one and see if you can stop the other.  You may find that you need both to be optimal. Keep working on exercise program and weight loss, you are doing well. followup with me in 6mos.

## 2012-05-27 NOTE — Progress Notes (Signed)
  Subjective:    Patient ID: Stephanie Buck, female    DOB: 1955/09/21, 56 y.o.   MRN: [id number]  HPI The patient comes in today for followup of her known COPD.  She has done very well since the last visit, and is participating in an exercise program and working on weight loss.  She has had no pulmonary infection or acute exacerbation since last visit.  She is having an upper respiratory issue currently, but feels it has gotten better over the last few days.   Review of Systems  Constitutional: Positive for unexpected weight change. Negative for fever.  HENT: Positive for ear pain, congestion, rhinorrhea, sneezing, postnasal drip and sinus pressure. Negative for nosebleeds, sore throat, trouble swallowing and dental problem.   Eyes: Negative for redness and itching.  Respiratory: Positive for cough. Negative for chest tightness, shortness of breath and wheezing.   Cardiovascular: Negative for palpitations and leg swelling.  Gastrointestinal: Negative for nausea and vomiting.  Genitourinary: Negative for dysuria.  Musculoskeletal: Negative for joint swelling.  Skin: Negative for rash.  Neurological: Positive for headaches.  Hematological: Does not bruise/bleed easily.  Psychiatric/Behavioral: Negative for dysphoric mood. The patient is nervous/anxious.        Objective:   Physical Exam Overweight female in no acute distress Nose without purulent discharge noted Neck without lymphadenopathy or thyromegaly Chest with clear breath sounds bilaterally, no wheezing Cardiac exam is regular rate and rhythm Lower extremities without edema, cyanosis Alert and oriented, moves all 4 extremities.        Assessment & Plan:

## 2012-05-30 ENCOUNTER — Telehealth: Payer: Self-pay | Admitting: Pulmonary Disease

## 2012-05-30 MED ORDER — CEFDINIR 300 MG PO CAPS: 300.0000 mg | ORAL_CAPSULE | Freq: Two times a day (BID) | ORAL | Status: DC

## 2012-05-30 NOTE — Telephone Encounter (Signed)
Pt last seen on 05-27-12 for COPD. At the time she was having some sinus congestion and slight cough x 3 days but felt she was getting some better at time of OV. However, pt states over the weekend her symptoms got much worse.  She states she is having sinus pressure and congestion, PND, body aches, low grade fever, productive cough with green phlegm, sinus headache, and ear pain and pressure. She denies any SOB or wheezing at this time. Pt is using simply saline nasal rinse. As per instructions at last OV the pt did stop the spiriva and is taking only symbicort but pt states she began to feel worse before she stopped the spiriva so does not feel this contributed to her symptoms getting worse.  Please advise. Carron Curie, CMA Allergies  Allergen Reactions  . Clindamycin/Lincomycin

## 2012-05-30 NOTE — Telephone Encounter (Signed)
Ok to call in [place] 300mg   Take 2 each day for 7 days. Can use mucinex DS one bid Consider neilmed sinus rinses am and pm until better.

## 2012-05-30 NOTE — Telephone Encounter (Signed)
Pt advised of recs and rx sent. Kawthar Ennen, CMA  

## 2012-05-30 NOTE — Telephone Encounter (Signed)
Pt calling again in ref to previous msg says if she needs to p/u a rx she needs a cb because she can't drive after dark.Stephanie Buck

## 2012-07-25 ENCOUNTER — Telehealth: Payer: Self-pay | Admitting: Pulmonary Disease

## 2012-07-25 NOTE — Telephone Encounter (Signed)
Spoke with patient, patient states she has been having cold/cough x 3days.  States she felt like she has been doing fine well off the spiriva and just using symbicort, however once she started coughing up grey mucus she thought she better go ahead and call Dr. Shelle Iron considering her previous lung issues.  Patient denies any other symptoms.  Patient states she does not feel like she needs to come in, but would like a rx called in to her pharmacy.  Patient has not tried any other meds for symptoms relief.  Dr. Shelle Iron please advise, thank you.  Last OV:05/27/12 with 6 month follow up Pharmacy: [facility]  Allergies  Allergen Reactions  . Clindamycin/Lincomycin

## 2012-07-25 NOTE — Telephone Encounter (Signed)
Spoke with pt and notified of recs per [facility] She verbalized understanding Will try advil or tylenol cold and sinus and call if not improving or gets worse

## 2012-07-25 NOTE — Telephone Encounter (Signed)
If she is not coughing up yellow or green, and she feels breathing is ok, would just treat this conservatively for now.  Can try tylenol cold and sinus or advil cold and sinus first.  If worsens or does not improve within another 4-5 days, she needs to call us.

## 2012-09-16 ENCOUNTER — Other Ambulatory Visit: Payer: Self-pay | Admitting: Pulmonary Disease

## 2012-11-14 ENCOUNTER — Emergency Department (HOSPITAL_COMMUNITY): Payer: Medicare Other

## 2012-11-14 ENCOUNTER — Emergency Department (HOSPITAL_COMMUNITY)
Admission: EM | Admit: 2012-11-14 | Discharge: 2012-11-14 | Disposition: A | Discharge status: Home / Self Care | Payer: Medicare Other | Attending: Emergency Medicine | Admitting: Emergency Medicine

## 2012-11-14 ENCOUNTER — Encounter (HOSPITAL_COMMUNITY): Payer: Self-pay | Admitting: Emergency Medicine

## 2012-11-14 DIAGNOSIS — Z87891 Personal history of nicotine dependence: Secondary | ICD-10-CM | POA: Insufficient documentation

## 2012-11-14 DIAGNOSIS — J449 Chronic obstructive pulmonary disease, unspecified: Secondary | ICD-10-CM | POA: Insufficient documentation

## 2012-11-14 DIAGNOSIS — S8010XA Contusion of unspecified lower leg, initial encounter: Secondary | ICD-10-CM | POA: Insufficient documentation

## 2012-11-14 DIAGNOSIS — R209 Unspecified disturbances of skin sensation: Secondary | ICD-10-CM | POA: Insufficient documentation

## 2012-11-14 DIAGNOSIS — M545 Low back pain: Secondary | ICD-10-CM

## 2012-11-14 DIAGNOSIS — J4489 Other specified chronic obstructive pulmonary disease: Secondary | ICD-10-CM | POA: Insufficient documentation

## 2012-11-14 DIAGNOSIS — Y929 Unspecified place or not applicable: Secondary | ICD-10-CM | POA: Insufficient documentation

## 2012-11-14 DIAGNOSIS — E785 Hyperlipidemia, unspecified: Secondary | ICD-10-CM | POA: Insufficient documentation

## 2012-11-14 DIAGNOSIS — R296 Repeated falls: Secondary | ICD-10-CM | POA: Insufficient documentation

## 2012-11-14 DIAGNOSIS — S8012XA Contusion of left lower leg, initial encounter: Secondary | ICD-10-CM

## 2012-11-14 DIAGNOSIS — Z79899 Other long term (current) drug therapy: Secondary | ICD-10-CM | POA: Insufficient documentation

## 2012-11-14 DIAGNOSIS — IMO0002 Reserved for concepts with insufficient information to code with codable children: Secondary | ICD-10-CM | POA: Insufficient documentation

## 2012-11-14 DIAGNOSIS — Y939 Activity, unspecified: Secondary | ICD-10-CM | POA: Insufficient documentation

## 2012-11-14 MED ORDER — IBUPROFEN 600 MG PO TABS: 600.0000 mg | ORAL_TABLET | Freq: Four times a day (QID) | ORAL | Status: DC | PRN

## 2012-11-14 MED ORDER — METHOCARBAMOL 500 MG PO TABS: 500.0000 mg | ORAL_TABLET | Freq: Two times a day (BID) | ORAL | Status: DC

## 2012-11-14 MED ORDER — KETOROLAC TROMETHAMINE 60 MG/2ML IM SOLN
60.0000 mg | Freq: Once | INTRAMUSCULAR | Status: AC
  Administered 2012-11-14: 60 mg via INTRAMUSCULAR
  Filled 2012-11-14: qty 2

## 2012-11-14 MED ORDER — TRAMADOL HCL 50 MG PO TABS: 50.0000 mg | ORAL_TABLET | Freq: Four times a day (QID) | ORAL | Status: DC | PRN

## 2012-11-14 NOTE — ED Notes (Signed)
Pt went to sit down and missed the chair, falling onto a hardwood floor. This happened 12 days ago. C/o pain left hip, down leg. States she has numbness in left foot.

## 2012-11-14 NOTE — ED Provider Notes (Signed)
History  This chart was scribed for Loren Racer, MD by Shari Heritage, ED Scribe. The patient was seen in room TR05C/TR05C. Patient's care was started at 1506.   CSN: [id number]  Arrival date & time 11/14/12  1257   First MD Initiated Contact with Patient 11/14/12 1506      Chief Complaint  Patient presents with  . Back Pain  . Leg Pain    Patient is a 57 y.o. female presenting with back pain.  Back Pain Location:  Lumbar spine Radiates to: left lateral leg down to foot. Pain severity:  Moderate Pain is:  Same all the time Duration:  12 days Timing:  Constant Progression:  Unchanged Relieved by:  Nothing Ineffective treatments:  Narcotics Associated symptoms: leg pain and numbness   Associated symptoms: no bladder incontinence, no bowel incontinence, no dysuria, no fever and no weakness      HPI Comments: Stephanie Buck is a 57 y.o. female who presents to the Emergency Department complaining of moderate, constant, unchanged left lower back pain that radiates to left hip and down left lateral leg resulting from a fall that occurred 12 days ago. Patient states that she fell from standing position. There is associated numbness to the left foot. She denies bowel or urinary incontinence, dysuria, fever, chills, weakness of extremities or any other pain at this time. She states that she has been taking Percocet at home without significant relief. She states that she has this prescription to manage chronic neck pain. Patient is ambulatory. Her other medical history includes COPD, hyperlipidemia and asthma.    Past Medical History  Diagnosis Date  . COPD (chronic obstructive pulmonary disease)   . Hyperlipidemia   . Asthma     Past Surgical History  Procedure Laterality Date  . Cervical disc surgery    . Abdominal hysterectomy    . Breast surgery      Family History  Problem Relation Age of Onset  . Coronary artery disease Mother   . Stroke Father   . Prostate cancer  Father   . Heart disease Brother   . Emphysema Brother     History  Substance Use Topics  . Smoking status: Former Smoker -- 1.50 packs/day for 41 years    Types: Cigarettes    Quit date: 11/30/2011  . Smokeless tobacco: Not on file  . Alcohol Use: No    OB History   Grav Para Term Preterm Abortions TAB SAB Ect Mult Living                  Review of Systems  Constitutional: Negative for fever and chills.  Gastrointestinal: Negative for bowel incontinence.  Genitourinary: Negative for bladder incontinence and dysuria.  Musculoskeletal: Positive for back pain.  Neurological: Positive for numbness. Negative for weakness.    Allergies  Clindamycin/lincomycin  Home Medications   Current Outpatient Rx  Name  Route  Sig  Dispense  Refill  . albuterol (PROVENTIL HFA;VENTOLIN HFA) 108 (90 BASE) MCG/ACT inhaler   Inhalation   Inhale 2 puffs into the lungs every 6 (six) hours as needed. For shortness of breath.         . ALPRAZolam (XANAX) 1 MG tablet   Oral   Take 1 mg by mouth 3 (three) times daily as needed for anxiety.          . budesonide-formoterol (SYMBICORT) 160-4.5 MCG/ACT inhaler   Inhalation   Inhale 2 puffs into the lungs 2 (two) times daily.         Marland Kitchen  levothyroxine (SYNTHROID, LEVOTHROID) 75 MCG tablet   Oral   Take 75 mcg by mouth daily before breakfast.         . oxyCODONE-acetaminophen (PERCOCET) 5-325 MG per tablet   Oral   Take 1 tablet by mouth every 4 (four) hours as needed. For pain.   30 tablet   0   . ibuprofen (ADVIL,MOTRIN) 600 MG tablet   Oral   Take 1 tablet (600 mg total) by mouth every 6 (six) hours as needed for pain.   30 tablet   0   . methocarbamol (ROBAXIN) 500 MG tablet   Oral   Take 1 tablet (500 mg total) by mouth 2 (two) times daily.   20 tablet   0   . traMADol (ULTRAM) 50 MG tablet   Oral   Take 1 tablet (50 mg total) by mouth every 6 (six) hours as needed for pain.   15 tablet   0     Triage Vitals: BP  137/75  Pulse 102  Temp(Src) 97.9 F (36.6 C) (Oral)  Resp 18  SpO2 96%  Physical Exam  Constitutional: She is oriented to person, place, and time. She appears well-developed and well-nourished.  HENT:  Head: Normocephalic.  Eyes: Conjunctivae and EOM are normal. Pupils are equal, round, and reactive to light.  Neck: Normal range of motion. Neck supple.  Pulmonary/Chest: No respiratory distress.  Musculoskeletal: Normal range of motion.  Mild midline lumbar tenderness to palpation, mostly left paraspinal tenderness to palpation. Tenderness to palpation of lateral aspect of left thigh. No deformity. Full ROM of hip and knee. Sensation fully intact. No saddle anesthesia. Positive straight leg raise.  Neurological: She is alert and oriented to person, place, and time.  Skin: Skin is warm and dry. No rash noted.    ED Course  Procedures (including critical care time) DIAGNOSTIC STUDIES: Oxygen Saturation is 96% on room air, normal by my interpretation.    COORDINATION OF CARE: 3:13 PM- Patient presents complaining of lower back pain that radiates into left hip and down left leg. Will order x-ray of lumbar spine to rule out acute bony fractures and administer 50 mg of Toradol. Patient informed of current plan for treatment and evaluation and agrees with plan at this time.    Dg Lumbar Spine 2-3 Views  11/14/2012   *RADIOLOGY REPORT*  Clinical Data: Low back and left leg pain.  LUMBAR SPINE - 2-3 VIEW  Comparison: Abdomen pelvis CT dated 12/01/2011.  Findings: Five non-rib bearing lumbar vertebrae.  Mild dextroconvex lumbar rotary scoliosis.  Anterior spur formation at multiple levels, remaining greatest at the L3-4 level.  No fractures, pars defects or subluxations.  IMPRESSION: Stable degenerative changes.  No acute abnormality.   Original Report Authenticated By: Beckie Salts, M.D.     1. Lumbar back pain   2. Contusion of left leg, initial encounter       MDM  I personally  performed the services described in this documentation, which was scribed in my presence. The recorded information has been reviewed and is accurate.     Loren Racer, MD 11/14/12 1901

## 2012-11-14 NOTE — ED Notes (Signed)
Pt c/o lower back and tailbone pain after falling 6 days ago; pt sts into left hip and down left leg

## 2012-11-24 ENCOUNTER — Encounter: Payer: Self-pay | Admitting: Pulmonary Disease

## 2012-11-24 ENCOUNTER — Ambulatory Visit (INDEPENDENT_AMBULATORY_CARE_PROVIDER_SITE_OTHER): Payer: Medicaid Other | Admitting: Pulmonary Disease

## 2012-11-24 VITALS — BP 130/90 | HR 85 | Temp 98.1°F | Ht 65.0 in | Wt 208.2 lb

## 2012-11-24 DIAGNOSIS — J449 Chronic obstructive pulmonary disease, unspecified: Secondary | ICD-10-CM

## 2012-11-24 NOTE — Assessment & Plan Note (Signed)
The patient had been doing well on symbicort alone, but has not gotten back to baseline after an episode of acute bronchitis.  However, she has gained 30 pounds since the last visit, and this is likely the cause of her dyspnea on exertion getting worse.  I would like to try her back on Spiriva with her symbicort, and have asked her to work aggressively on weight loss.  She is to let me know if things do not normalize.

## 2012-11-24 NOTE — Patient Instructions (Addendum)
Stay on symbicort Try spiriva again at one inhalation each am for next 3-4 weeks.  If you do not see any change in your breathing, discontinue and stay on symbicort. Work hard on weight loss.  This will have a big impact on your breathing.  followup with me in 6mos, but call if you are having breathing issues.

## 2012-11-24 NOTE — Progress Notes (Signed)
  Subjective:    Patient ID: Stephanie Buck, female    DOB: 22-Feb-1956, 57 y.o.   MRN: [id number]  HPI The patient comes in today for followup of her known COPD.  At the last visit, I asked her to stop Spiriva and to continue on symbicort to see how she did.  The patient did very well, and was exercising consistently and had improved conditioning.  She had an episode of acute bronchitis earlier in the year which was treated with multiple rounds of antibiotics and prednisone.  She got over her bronchitis symptoms, but her dyspnea on exertion has persisted.  She has not gotten back to her prior baseline.  Complicating all of this is a 30 pound weight gain since the last visit.  Patient denies any significant cough or mucus production currently.   Review of Systems  Constitutional: Negative for fever and unexpected weight change.  HENT: Positive for sore throat and trouble swallowing. Negative for ear pain, nosebleeds, congestion, rhinorrhea, sneezing, dental problem, postnasal drip and sinus pressure.   Eyes: Negative for redness and itching.  Respiratory: Positive for cough, chest tightness, shortness of breath and wheezing. Negative for choking.   Cardiovascular: Negative for palpitations and leg swelling.  Gastrointestinal: Negative for nausea and vomiting.  Genitourinary: Negative for dysuria.  Musculoskeletal: Negative for joint swelling.  Skin: Negative for rash.  Neurological: Positive for headaches.  Hematological: Bruises/bleeds easily.  Psychiatric/Behavioral: Negative for dysphoric mood. The patient is nervous/anxious.        Objective:   Physical Exam Obese female in no acute distress Nose without purulence or discharge noted Neck without lymphadenopathy or thyromegaly Chest totally clear to auscultation, no wheezing Cardiac exam regular rate and rhythm Lower extremities without edema, cyanosis Alert and oriented, moves all 4 extremities.       Assessment & Plan:

## 2012-12-02 ENCOUNTER — Other Ambulatory Visit: Payer: Self-pay | Admitting: Neurosurgery

## 2012-12-02 DIAGNOSIS — M545 Low back pain: Secondary | ICD-10-CM

## 2012-12-03 ENCOUNTER — Ambulatory Visit
Admission: RE | Admit: 2012-12-03 | Discharge: 2012-12-03 | Disposition: A | Discharge status: Home / Self Care | Payer: Medicare Other | Source: Ambulatory Visit | Attending: Neurosurgery | Admitting: Neurosurgery

## 2012-12-03 DIAGNOSIS — M545 Low back pain: Secondary | ICD-10-CM

## 2013-01-06 ENCOUNTER — Telehealth: Payer: Self-pay | Admitting: Pulmonary Disease

## 2013-01-06 MED ORDER — BUDESONIDE-FORMOTEROL FUMARATE 160-4.5 MCG/ACT IN AERO: 2.0000 | INHALATION_SPRAY | Freq: Two times a day (BID) | RESPIRATORY_TRACT | Status: DC

## 2013-01-06 NOTE — Telephone Encounter (Signed)
Refill of the symbicort has been sent in to the pharmacy and nothing further is needed.

## 2013-04-10 ENCOUNTER — Other Ambulatory Visit: Payer: Self-pay | Admitting: Internal Medicine

## 2013-04-10 DIAGNOSIS — Z1231 Encounter for screening mammogram for malignant neoplasm of breast: Secondary | ICD-10-CM

## 2013-04-25 ENCOUNTER — Ambulatory Visit: Payer: Medicare Other

## 2013-05-04 ENCOUNTER — Other Ambulatory Visit: Payer: Self-pay

## 2013-05-17 ENCOUNTER — Ambulatory Visit
Admission: RE | Admit: 2013-05-17 | Discharge: 2013-05-17 | Disposition: A | Discharge status: Home / Self Care | Payer: Medicare Other | Source: Ambulatory Visit | Attending: Internal Medicine | Admitting: Internal Medicine

## 2013-05-17 DIAGNOSIS — Z1231 Encounter for screening mammogram for malignant neoplasm of breast: Secondary | ICD-10-CM

## 2013-05-29 ENCOUNTER — Ambulatory Visit (INDEPENDENT_AMBULATORY_CARE_PROVIDER_SITE_OTHER)
Admission: RE | Admit: 2013-05-29 | Discharge: 2013-05-29 | Disposition: A | Discharge status: Home / Self Care | Payer: Medicare Other | Source: Ambulatory Visit | Attending: Pulmonary Disease | Admitting: Pulmonary Disease

## 2013-05-29 ENCOUNTER — Other Ambulatory Visit: Payer: Self-pay | Admitting: Internal Medicine

## 2013-05-29 ENCOUNTER — Ambulatory Visit (INDEPENDENT_AMBULATORY_CARE_PROVIDER_SITE_OTHER): Payer: Medicare Other | Admitting: Pulmonary Disease

## 2013-05-29 ENCOUNTER — Encounter: Payer: Self-pay | Admitting: Pulmonary Disease

## 2013-05-29 VITALS — BP 122/78 | HR 79 | Temp 97.7°F | Ht 65.0 in | Wt 222.2 lb

## 2013-05-29 DIAGNOSIS — J439 Emphysema, unspecified: Secondary | ICD-10-CM

## 2013-05-29 DIAGNOSIS — J438 Other emphysema: Secondary | ICD-10-CM

## 2013-05-29 NOTE — Assessment & Plan Note (Signed)
The patient continues to have significant dyspnea on exertion and now associated with desaturation, but I do not believe this is secondary to her underlying obstructive lung disease. She has gained a significant amount of weight over a short period of time, and she believes this is related to fluid retention. She has crackles on exam today, but no wheezing. I suspect she will need aggressive diuresis, and she is scheduled to see her primary physician in 2 days. I will continue her on her current bronchodilator regimen, to see if she will have improvement with diuresis. Will also hold off on exertional oxygen until she has had a chance to drop fluid weight. Finally, I will check a chest x-ray today for completeness to see if she has interstitial edema or other acute process.

## 2013-05-29 NOTE — Progress Notes (Signed)
   Subjective:    Patient ID: Stephanie Buck, female    DOB: 03-02-1956, 57 y.o.   MRN: [id number]  HPI The patient comes in today for followup of her known moderate COPD. She is staying on symbicort, but has not restarted Spiriva. She has noted worsening shortness of breath since the last visit, but has continued to gain weight. She was up 30 pounds at her last visit with me, and has gained 14 more since then. She tells me that her abdomen, upper extremities, neck, and face are all swelling that she believes is fluid retention. She has not had a lot of lower extremity edema. Her saturations fall with walking from the waiting room today, but she has checked them at home periodically with no issues. She denies any chest congestion, cough, or mucus production.   Review of Systems  Constitutional: Negative for fever and unexpected weight change.  HENT: Negative for congestion, dental problem, ear pain, nosebleeds, postnasal drip, rhinorrhea, sinus pressure, sneezing, sore throat and trouble swallowing.   Eyes: Negative for redness and itching.  Respiratory: Positive for shortness of breath and wheezing. Negative for cough and chest tightness.   Cardiovascular: Positive for leg swelling ( legs/ankles/feet/stomach). Negative for palpitations.  Gastrointestinal: Negative for nausea and vomiting.  Genitourinary: Negative for dysuria.  Musculoskeletal: Negative for joint swelling.  Skin: Negative for rash.  Neurological: Negative for headaches.  Hematological: Does not bruise/bleed easily.  Psychiatric/Behavioral: Negative for dysphoric mood. The patient is not nervous/anxious.        Objective:   Physical Exam Obese female in no acute distress Nose without purulence or discharge noted Neck without lymphadenopathy or thyromegaly Chest with scattered crackles throughout, no wheezing and adequate air flow. Cardiac exam with regular rate and rhythm Lower extremities with minimal edema, no  cyanosis Alert and oriented, moves all 4 extremities.       Assessment & Plan:

## 2013-05-29 NOTE — Patient Instructions (Signed)
Keep your apptm with your primary physician this week.  I think you need medication to help with fluid removal. Will check chest xray today, and call you with results. Stay on symbicort, but would restart spiriva if you still have shortness of breath even after getting fluid off.  Will hold off on starting oxygen, to see if your levels improve with fluid removal. followup with me in 6mos. If doing well.

## 2013-05-30 ENCOUNTER — Telehealth: Payer: Self-pay | Admitting: Pulmonary Disease

## 2013-05-30 NOTE — Telephone Encounter (Signed)
Result Note     Please let pt know that cxr looks fine    Pt aware of results; no questions or concerns at this time.

## 2013-05-30 NOTE — Progress Notes (Signed)
Quick Note:  Pt aware of results;no questions or concerns at this time. ______ 

## 2013-07-05 ENCOUNTER — Other Ambulatory Visit: Payer: Self-pay | Admitting: Pulmonary Disease

## 2013-09-21 ENCOUNTER — Other Ambulatory Visit (HOSPITAL_COMMUNITY): Payer: Self-pay | Admitting: Internal Medicine

## 2013-09-21 ENCOUNTER — Ambulatory Visit (HOSPITAL_COMMUNITY)
Admission: RE | Admit: 2013-09-21 | Discharge: 2013-09-21 | Disposition: A | Discharge status: Home / Self Care | Payer: Medicare Other | Source: Ambulatory Visit | Attending: Internal Medicine | Admitting: Internal Medicine

## 2013-09-21 DIAGNOSIS — M542 Cervicalgia: Secondary | ICD-10-CM

## 2013-09-21 DIAGNOSIS — R0602 Shortness of breath: Secondary | ICD-10-CM

## 2013-09-21 DIAGNOSIS — W19XXXA Unspecified fall, initial encounter: Secondary | ICD-10-CM | POA: Insufficient documentation

## 2013-10-19 ENCOUNTER — Telehealth: Payer: Self-pay | Admitting: Pulmonary Disease

## 2013-10-19 NOTE — Telephone Encounter (Signed)
Called and spoke with pt and she stated that she has been seeing her primary care doctor --Dr. Herma ArdSammi Hassan.  She stated that she has cxr done at the hospital 08/2013 and would like to know if [facility] can look at this xray and advise on the results.  She stated that her PCP told her he saw some fluid on her lungs and is wanting to set her up with a CT with and without contrast.  [facility]  Please advise. Thanks  Last ov--05/29/2013 Next ov--11/28/2013  Allergies  Allergen Reactions  . Clindamycin/Lincomycin Other (See Comments)    Causes patient to get C Diff.      Current Outpatient Prescriptions on File Prior to Visit  Medication Sig Dispense Refill  . albuterol (PROVENTIL HFA;VENTOLIN HFA) 108 (90 BASE) MCG/ACT inhaler Inhale 2 puffs into the lungs every 6 (six) hours as needed. For shortness of breath.      . ALPRAZolam (XANAX) 1 MG tablet Take 1 mg by mouth 3 (three) times daily as needed for anxiety.       Marland Kitchen. amitriptyline (ELAVIL) 25 MG tablet Take 1 tablet by mouth at bedtime.      Marland Kitchen. atenolol (TENORMIN) 50 MG tablet Take 1 tablet by mouth every morning.      Marland Kitchen. CRESTOR 10 MG tablet Take 1 tablet by mouth daily.      . hydrochlorothiazide (HYDRODIURIL) 25 MG tablet Take 1 tablet by mouth daily.      Marland Kitchen. levothyroxine (SYNTHROID, LEVOTHROID) 75 MCG tablet Take 75 mcg by mouth daily before breakfast.      . oxyCODONE-acetaminophen (PERCOCET) 5-325 MG per tablet Take 1 tablet by mouth every 4 (four) hours as needed. For pain.  30 tablet  0  . SYMBICORT 160-4.5 MCG/ACT inhaler USE 2 PUFFS BY MOUTH TWICE DAILY  10.2 g  4   No current facility-administered medications on file prior to visit.

## 2013-10-20 NOTE — Telephone Encounter (Signed)
She has an abnormality in the bottom of the right lung that appears to be a cyst, but hard to tell.  She does need ct chest to better characterize.

## 2013-10-20 NOTE — Telephone Encounter (Signed)
Called and spoke with pt and she is aware of [facility] recs from the cxr that was done by the pts PCP.  Pt stated that she will call her PCP to set up this CT scan that they wanted to do.  Nothing further is needed.

## 2013-10-23 ENCOUNTER — Other Ambulatory Visit (HOSPITAL_COMMUNITY): Payer: Self-pay | Admitting: Internal Medicine

## 2013-10-23 DIAGNOSIS — R9389 Abnormal findings on diagnostic imaging of other specified body structures: Secondary | ICD-10-CM

## 2013-10-25 ENCOUNTER — Ambulatory Visit (HOSPITAL_COMMUNITY): Payer: PRIVATE HEALTH INSURANCE

## 2013-11-06 ENCOUNTER — Ambulatory Visit (HOSPITAL_COMMUNITY)
Admission: RE | Admit: 2013-11-06 | Discharge: 2013-11-06 | Disposition: A | Discharge status: Home / Self Care | Payer: PRIVATE HEALTH INSURANCE | Source: Ambulatory Visit | Attending: Internal Medicine | Admitting: Internal Medicine

## 2013-11-06 ENCOUNTER — Encounter (HOSPITAL_COMMUNITY): Payer: Self-pay

## 2013-11-06 DIAGNOSIS — R05 Cough: Secondary | ICD-10-CM | POA: Insufficient documentation

## 2013-11-06 DIAGNOSIS — R42 Dizziness and giddiness: Secondary | ICD-10-CM | POA: Diagnosis not present

## 2013-11-06 DIAGNOSIS — R0602 Shortness of breath: Secondary | ICD-10-CM | POA: Diagnosis present

## 2013-11-06 DIAGNOSIS — R059 Cough, unspecified: Secondary | ICD-10-CM | POA: Insufficient documentation

## 2013-11-06 DIAGNOSIS — R9389 Abnormal findings on diagnostic imaging of other specified body structures: Secondary | ICD-10-CM

## 2013-11-06 DIAGNOSIS — J438 Other emphysema: Secondary | ICD-10-CM | POA: Diagnosis not present

## 2013-11-06 HISTORY — DX: Essential (primary) hypertension: I10

## 2013-11-06 MED ORDER — IOHEXOL 300 MG/ML  SOLN
80.0000 mL | Freq: Once | INTRAMUSCULAR | Status: AC | PRN
  Administered 2013-11-06: 80 mL via INTRAVENOUS

## 2013-11-27 ENCOUNTER — Ambulatory Visit: Payer: Medicare Other | Admitting: Pulmonary Disease

## 2013-11-28 ENCOUNTER — Ambulatory Visit (INDEPENDENT_AMBULATORY_CARE_PROVIDER_SITE_OTHER): Payer: Medicare Other | Admitting: Pulmonary Disease

## 2013-11-28 ENCOUNTER — Encounter: Payer: Self-pay | Admitting: Pulmonary Disease

## 2013-11-28 VITALS — BP 132/88 | HR 64 | Temp 98.1°F | Ht 65.0 in | Wt 224.4 lb

## 2013-11-28 DIAGNOSIS — J438 Other emphysema: Secondary | ICD-10-CM

## 2013-11-28 DIAGNOSIS — J439 Emphysema, unspecified: Secondary | ICD-10-CM

## 2013-11-28 MED ORDER — TIOTROPIUM BROMIDE MONOHYDRATE 2.5 MCG/ACT IN AERS: 2.0000 | INHALATION_SPRAY | Freq: Every day | RESPIRATORY_TRACT | Status: DC

## 2013-11-28 NOTE — Progress Notes (Signed)
   Subjective:    Patient ID: Stephanie Buck, female    DOB: 1956/02/25, 58 y.o.   MRN: [id number]  HPI The patient comes in today for followup of her known COPD. At the last visit, I was very concerned about her fluid retention, and the patient tells me that she gained 16 additional pounds even from that visit related to fluid. She has been started on a diuretic by her primary physician, and has seen significant improvement in her swelling and also a drop in her weight. She continues to have severe shortness of breath with any activity, and is also having a lot of back issues. She never did start on Spiriva as I asked from the last visit, but is staying on Symbicort. We also talked about starting her on ambulatory oxygen once we saw, and she did or did not improve with diuresis. She denies any significant congestion or cough.   Review of Systems  Constitutional: Negative for fever and unexpected weight change.  HENT: Negative for congestion, dental problem, ear pain, nosebleeds, postnasal drip, rhinorrhea, sinus pressure, sneezing, sore throat and trouble swallowing.   Eyes: Negative for redness and itching.  Respiratory: Positive for shortness of breath. Negative for cough, chest tightness and wheezing.   Cardiovascular: Negative for palpitations and leg swelling.  Gastrointestinal: Negative for nausea and vomiting.  Genitourinary: Negative for dysuria.  Musculoskeletal: Positive for back pain. Negative for joint swelling.  Skin: Negative for rash.  Neurological: Negative for headaches.  Hematological: Does not bruise/bleed easily.  Psychiatric/Behavioral: Negative for dysphoric mood. The patient is not nervous/anxious.        Objective:   Physical Exam Overweight female in no acute distress Nose without purulence or discharge noted Neck without lymphadenopathy or thyromegaly Chest fairly clear to auscultation, no wheezing Cardiac exam with regular rate and rhythm Lower extremities  with mild ankle edema, no cyanosis Alert and oriented, moves all 4 extremities.       Assessment & Plan:

## 2013-11-28 NOTE — Addendum Note (Signed)
Addended by: Darrell Jewel on: 11/28/2013 05:12 PM   Modules accepted: Orders

## 2013-11-28 NOTE — Assessment & Plan Note (Addendum)
The patient is continuing to have significant dyspnea on exertion, but only a part of this is related to her moderate airflow obstruction. She never did restart Spiriva, and I will get her to do this in addition to her Symbicort. I also stressed to her the importance of weight loss and conditioning, and how monitoring her fluid balance carefully will all help her shortness of breath.   She also continues to desaturate with exertion, and we'll therefore start her on ambulatory oxygen. Hopefully this will allow her to become more active so that she can better work on weight reduction.

## 2013-11-28 NOTE — Patient Instructions (Addendum)
Stay on symbicort 2 puffs am and pm.  Keep mouth rinsed. Will start on spiriva respimat 2 inhalations each am as a trial.  Will send in prescription as well that you can fill if you think the medication helps you. Work on weight loss and conditioning. Keep up with your fluid balance, and continue on your medication for this. Will start on oxygen with portable concentrator with exertional activities. followup with me again in 57mos.

## 2013-11-28 NOTE — Addendum Note (Signed)
Addended by: Darrell Jewel on: 11/28/2013 10:34 AM   Modules accepted: Orders

## 2013-12-04 ENCOUNTER — Telehealth: Payer: Self-pay | Admitting: Pulmonary Disease

## 2013-12-04 NOTE — Telephone Encounter (Signed)
Per 0V 11/28/13: Will start on oxygen with portable concentrator with exertional activities.    Called pt and made her aware of the above. Nothing further needed

## 2013-12-13 ENCOUNTER — Other Ambulatory Visit: Payer: Self-pay | Admitting: Pulmonary Disease

## 2014-04-12 ENCOUNTER — Other Ambulatory Visit: Payer: Self-pay

## 2014-04-12 DIAGNOSIS — Z1231 Encounter for screening mammogram for malignant neoplasm of breast: Secondary | ICD-10-CM

## 2014-05-05 ENCOUNTER — Emergency Department (HOSPITAL_COMMUNITY): Payer: Medicare Other

## 2014-05-05 ENCOUNTER — Emergency Department (HOSPITAL_COMMUNITY)
Admission: EM | Admit: 2014-05-05 | Discharge: 2014-05-05 | Disposition: A | Discharge status: Home / Self Care | Payer: Medicare Other | Attending: Emergency Medicine | Admitting: Emergency Medicine

## 2014-05-05 ENCOUNTER — Encounter (HOSPITAL_COMMUNITY): Payer: Self-pay | Admitting: *Deleted

## 2014-05-05 DIAGNOSIS — Z87891 Personal history of nicotine dependence: Secondary | ICD-10-CM | POA: Diagnosis not present

## 2014-05-05 DIAGNOSIS — W19XXXA Unspecified fall, initial encounter: Secondary | ICD-10-CM

## 2014-05-05 DIAGNOSIS — Z79899 Other long term (current) drug therapy: Secondary | ICD-10-CM | POA: Insufficient documentation

## 2014-05-05 DIAGNOSIS — J449 Chronic obstructive pulmonary disease, unspecified: Secondary | ICD-10-CM | POA: Insufficient documentation

## 2014-05-05 DIAGNOSIS — W010XXA Fall on same level from slipping, tripping and stumbling without subsequent striking against object, initial encounter: Secondary | ICD-10-CM | POA: Insufficient documentation

## 2014-05-05 DIAGNOSIS — S99921A Unspecified injury of right foot, initial encounter: Secondary | ICD-10-CM | POA: Diagnosis present

## 2014-05-05 DIAGNOSIS — Z7951 Long term (current) use of inhaled steroids: Secondary | ICD-10-CM | POA: Insufficient documentation

## 2014-05-05 DIAGNOSIS — Y9389 Activity, other specified: Secondary | ICD-10-CM | POA: Diagnosis not present

## 2014-05-05 DIAGNOSIS — I10 Essential (primary) hypertension: Secondary | ICD-10-CM | POA: Insufficient documentation

## 2014-05-05 DIAGNOSIS — Y998 Other external cause status: Secondary | ICD-10-CM | POA: Diagnosis not present

## 2014-05-05 DIAGNOSIS — S93401A Sprain of unspecified ligament of right ankle, initial encounter: Secondary | ICD-10-CM | POA: Insufficient documentation

## 2014-05-05 DIAGNOSIS — S93601A Unspecified sprain of right foot, initial encounter: Secondary | ICD-10-CM | POA: Diagnosis not present

## 2014-05-05 DIAGNOSIS — Y9289 Other specified places as the place of occurrence of the external cause: Secondary | ICD-10-CM | POA: Diagnosis not present

## 2014-05-05 DIAGNOSIS — E785 Hyperlipidemia, unspecified: Secondary | ICD-10-CM | POA: Insufficient documentation

## 2014-05-05 DIAGNOSIS — Z859 Personal history of malignant neoplasm, unspecified: Secondary | ICD-10-CM | POA: Insufficient documentation

## 2014-05-05 NOTE — ED Notes (Signed)
Pt reports falling one month ago and still having pain to right ankle, foot and hand. No acute distress noted at triage.

## 2014-05-05 NOTE — Discharge Instructions (Signed)
There is a brace for support. Continue to keep her ankle elevated, ice, continue taking anti-inflammatories. Follow-up with your doctor for that and possible referral to orthopedic specialist. The x-rays are negative today.   Ankle Sprain An ankle sprain is an injury to the strong, fibrous tissues (ligaments) that hold the bones of your ankle joint together.  CAUSES An ankle sprain is usually caused by a fall or by twisting your ankle. Ankle sprains most commonly occur when you step on the outer edge of your foot, and your ankle turns inward. People who participate in sports are more prone to these types of injuries.  SYMPTOMS   Pain in your ankle. The pain may be present at rest or only when you are trying to stand or walk.  Swelling.  Bruising. Bruising may develop immediately or within 1 to 2 days after your injury.  Difficulty standing or walking, particularly when turning corners or changing directions. DIAGNOSIS  Your caregiver will ask you details about your injury and perform a physical exam of your ankle to determine if you have an ankle sprain. During the physical exam, your caregiver will press on and apply pressure to specific areas of your foot and ankle. Your caregiver will try to move your ankle in certain ways. An X-ray exam may be done to be sure a bone was not broken or a ligament did not separate from one of the bones in your ankle (avulsion fracture).  TREATMENT  Certain types of braces can help stabilize your ankle. Your caregiver can make a recommendation for this. Your caregiver may recommend the use of medicine for pain. If your sprain is severe, your caregiver may refer you to a surgeon who helps to restore function to parts of your skeletal system (orthopedist) or a physical therapist. HOME CARE INSTRUCTIONS   Apply ice to your injury for 1-2 days or as directed by your caregiver. Applying ice helps to reduce inflammation and pain.  Put ice in a plastic bag.  Place  a towel between your skin and the bag.  Leave the ice on for 15-20 minutes at a time, every 2 hours while you are awake.  Only take over-the-counter or prescription medicines for pain, discomfort, or fever as directed by your caregiver.  Elevate your injured ankle above the level of your heart as much as possible for 2-3 days.  If your caregiver recommends crutches, use them as instructed. Gradually put weight on the affected ankle. Continue to use crutches or a cane until you can walk without feeling pain in your ankle.  If you have a plaster splint, wear the splint as directed by your caregiver. Do not rest it on anything harder than a pillow for the first 24 hours. Do not put weight on it. Do not get it wet. You may take it off to take a shower or bath.  You may have been given an elastic bandage to wear around your ankle to provide support. If the elastic bandage is too tight (you have numbness or tingling in your foot or your foot becomes cold and blue), adjust the bandage to make it comfortable.  If you have an air splint, you may blow more air into it or let air out to make it more comfortable. You may take your splint off at night and before taking a shower or bath. Wiggle your toes in the splint several times per day to decrease swelling. SEEK MEDICAL CARE IF:   You have rapidly increasing bruising  or swelling.  Your toes feel extremely cold or you lose feeling in your foot.  Your pain is not relieved with medicine. SEEK IMMEDIATE MEDICAL CARE IF:  Your toes are numb or blue.  You have severe pain that is increasing. MAKE SURE YOU:   Understand these instructions.  Will watch your condition.  Will get help right away if you are not doing well or get worse. Document Released: 06/15/2005 Document Revised: 03/09/2012 Document Reviewed: 06/27/2011 [facility] Patient Information 2015 [place] [place]. This information is not intended to replace advice given to you by your health  care provider. Make sure you discuss any questions you have with your health care provider.

## 2014-05-05 NOTE — ED Provider Notes (Signed)
CSN: [id number]     Arrival date & time 05/05/14  1608 History  This chart was scribed for non-physician practitioner, Jaynie Crumble, PA-C,working with Gerhard Munch, MD, by Karle Plumber, ED Scribe. This patient was seen in room TR06C/TR06C and the patient's care was started at 4:41 PM.  Chief Complaint  Patient presents with  . Fall  . Foot Pain   Patient is a 58 y.o. female presenting with fall and lower extremity pain. The history is provided by the patient. No language interpreter was used.  Fall  Foot Pain    HPI Comments:  Stephanie Buck is a 58 y.o. female with PMH of COPD, hyperlipidemia, asthma, HTN and cancer who presents to the Emergency Department complaining of a fall that occurred approximately one month ago. She states she tripped and fell over her dog and twisted the foot. She reports having continuing severe right foot pain since the incident on the dorsal lateral side. Reports some mild medial pain of the foot as well. She reports soaking the foot in epsom salt, elevating and icing it with only mild relief of the pain. She denies right knee pain, LOC, head injury, numbness, tingling or weakness of the left foot or wounds.   Past Medical History  Diagnosis Date  . COPD (chronic obstructive pulmonary disease)   . Hyperlipidemia   . Asthma   . Hypertension   . Cancer    Past Surgical History  Procedure Laterality Date  . Cervical disc surgery    . Abdominal hysterectomy    . Breast surgery     Family History  Problem Relation Age of Onset  . Coronary artery disease Mother   . Stroke Father   . Prostate cancer Father   . Heart disease Brother   . Emphysema Brother    History  Substance Use Topics  . Smoking status: Former Smoker -- 1.50 packs/day for 41 years    Types: Cigarettes    Quit date: 11/30/2011  . Smokeless tobacco: Not on file  . Alcohol Use: No   OB History    No data available     Review of Systems  Musculoskeletal: Positive for  arthralgias.  Skin: Negative for wound.  Neurological: Negative for syncope, weakness and numbness.    Allergies  Clindamycin/lincomycin and Trazodone and nefazodone  Home Medications   Prior to Admission medications   Medication Sig Start Date End Date Taking? Authorizing Provider  albuterol (PROVENTIL HFA;VENTOLIN HFA) 108 (90 BASE) MCG/ACT inhaler Inhale 2 puffs into the lungs every 6 (six) hours as needed. For shortness of breath.    Historical Provider, MD  ALPRAZolam Prudy Feeler) 1 MG tablet Take 1 mg by mouth 3 (three) times daily as needed for anxiety.     Historical Provider, MD  amitriptyline (ELAVIL) 25 MG tablet Take 1 tablet by mouth at bedtime. 05/10/13   Historical Provider, MD  atenolol (TENORMIN) 50 MG tablet Take 1 tablet by mouth every morning. 05/04/13   Historical Provider, MD  citalopram (CELEXA) 20 MG tablet Take 1 tablet by mouth daily. 11/14/13   Historical Provider, MD  CRESTOR 10 MG tablet Take 1 tablet by mouth daily. 05/10/13   Historical Provider, MD  furosemide (LASIX) 40 MG tablet Take 1 tablet by mouth daily. 11/02/13   Historical Provider, MD  hydrochlorothiazide (HYDRODIURIL) 25 MG tablet Take 1 tablet by mouth daily. 04/27/13   Historical Provider, MD  levothyroxine (SYNTHROID, LEVOTHROID) 75 MCG tablet Take 75 mcg by mouth daily before breakfast.  Historical Provider, MD  omeprazole (PRILOSEC) 20 MG capsule Take 1 capsule by mouth daily. 11/02/13   Historical Provider, MD  oxyCODONE-acetaminophen (PERCOCET) 5-325 MG per tablet Take 1 tablet by mouth every 4 (four) hours as needed. For pain. 12/03/11   Stephani PoliceMarianne L York, PA-C  SYMBICORT 160-4.5 MCG/ACT inhaler INHALE TWO PUFFS TWICE DAILY 12/13/13   Barbaraann ShareKeith M Clance, MD  Tiotropium Bromide Monohydrate (SPIRIVA RESPIMAT) 2.5 MCG/ACT AERS Inhale 2 puffs into the lungs daily. 11/28/13   Barbaraann ShareKeith M Clance, MD   Triage Vitals: BP 105/68 mmHg  Pulse 67  Temp(Src) 97.9 F (36.6 C) (Oral)  Resp 18  SpO2 94% Physical Exam   Constitutional: She is oriented to person, place, and time. She appears well-developed and well-nourished.  HENT:  Head: Normocephalic and atraumatic.  Eyes: EOM are normal.  Neck: Normal range of motion.  Cardiovascular: Normal rate.   Pulmonary/Chest: Effort normal.  Musculoskeletal: Normal range of motion.  Normal-appearing right ankle and right foot. Tender to palpation over lateral malleolus of the right ankle and right fifth metatarsal of the foot. Pain with any flexion or dorsiflexion of the ankle, pain with inversion eversion of the ankle. Normal rest of the foot. DP pulses intact. Normal and full range of motion of all toes. No tenderness over the knee joint.  Neurological: She is alert and oriented to person, place, and time.  Skin: Skin is warm and dry.  Psychiatric: She has a normal mood and affect. Her behavior is normal.  Nursing note and vitals reviewed.   ED Course  Procedures (including critical care time) DIAGNOSTIC STUDIES: Oxygen Saturation is 94% on RA, adequate by my interpretation.   COORDINATION OF CARE: 4:45 PM- Will X-Ray right foot. Pt verbalizes understanding and agrees to plan.  Medications - No data to display  Labs Review Labs Reviewed - No data to display  Imaging Review Dg Ankle Complete Right  05/05/2014   CLINICAL DATA:  Pt tripped over her dog x 1 month ago. Pain to the top of her right foot and posterior right ankle.  EXAM: RIGHT ANKLE - COMPLETE 3+ VIEW  COMPARISON:  None.  FINDINGS: No fracture. Ankle mortise is normally spaced and aligned. Moderate plantar calcaneal spur. Normal soft tissues.  IMPRESSION: No fracture or acute finding.   Electronically Signed   By: Amie Portlandavid  Ormond M.D.   On: 05/05/2014 17:20   Dg Foot Complete Right  05/05/2014   CLINICAL DATA:  Tripped over dog 1 month ago  EXAM: RIGHT FOOT COMPLETE - 3+ VIEW  COMPARISON:  None.  FINDINGS: No fracture or dislocation of mid foot or forefoot. The phalanges are normal. The  calcaneus is normal. There is plantar spurring the calcaneus. No soft tissue abnormality.  IMPRESSION: No acute osseous abnormality.   Electronically Signed   By: Genevive BiStewart  Edmunds M.D.   On: 05/05/2014 17:21     EKG Interpretation None      MDM   Final diagnoses:  Fall  Ankle sprain, right, initial encounter  Foot sprain, right, initial encounter    Assessment ankle and foot injury after a fall 1 month ago, sent here by her primary care doctor for x-rays. X-rays obtained of the ankle and foot and are negative. ASO brace provided for support, will follow up with her primary care doctor. Anti-inflammatories at home.  Filed Vitals:   05/05/14 1613  BP: 105/68  Pulse: 67  Temp: 97.9 F (36.6 C)  TempSrc: Oral  Resp: 18  SpO2: 94%  I personally performed the services described in this documentation, which was scribed in my presence. The recorded information has been reviewed and is accurate.    Lottie Musselatyana A Deena Shaub, PA-C 05/05/14 1738  Gerhard Munchobert Lockwood, MD 05/05/14 2011

## 2014-05-18 ENCOUNTER — Ambulatory Visit
Admission: RE | Admit: 2014-05-18 | Discharge: 2014-05-18 | Disposition: A | Discharge status: Home / Self Care | Payer: Medicare Other | Source: Ambulatory Visit

## 2014-05-18 DIAGNOSIS — Z1231 Encounter for screening mammogram for malignant neoplasm of breast: Secondary | ICD-10-CM

## 2014-05-30 ENCOUNTER — Encounter: Payer: Self-pay | Admitting: Pulmonary Disease

## 2014-05-30 ENCOUNTER — Ambulatory Visit (INDEPENDENT_AMBULATORY_CARE_PROVIDER_SITE_OTHER): Payer: Medicare Other | Admitting: Pulmonary Disease

## 2014-05-30 VITALS — BP 122/70 | HR 63 | Temp 97.6°F | Ht 64.5 in | Wt 204.8 lb

## 2014-05-30 DIAGNOSIS — J438 Other emphysema: Secondary | ICD-10-CM

## 2014-05-30 NOTE — Patient Instructions (Signed)
Continue on your current breathing medications Try zyrtec 10mg  OTC for your hoarseness, ?postnasal drip.  Make sure you are rinsing well after using your inhaler. Keep working on weight loss.  You are doing fantastic followup with me again in 6mos.

## 2014-05-30 NOTE — Progress Notes (Signed)
   Subjective:    Patient ID: Stephanie Buck, female    DOB: 07/13/1955, 58 y.o.   MRN: [id number]  HPI The patient comes in today for follow-up of her known COPD. She has found the combination of Spiriva and Symbicort to work very well with her, and she has been staying on oxygen with heavy her exertional activity. The patient has lost 20 pounds since last visit, and feels that it has made a significant impact to her breathing. She denies any significant cough or congestion, and has are he had her flu shot this year.   Review of Systems  Constitutional: Negative for fever and unexpected weight change.  HENT: Positive for sore throat and trouble swallowing. Negative for congestion, dental problem, ear pain, nosebleeds, postnasal drip, rhinorrhea, sinus pressure and sneezing.   Eyes: Negative for redness and itching.  Respiratory: Positive for shortness of breath and wheezing. Negative for cough and chest tightness.   Cardiovascular: Negative for palpitations and leg swelling.  Gastrointestinal: Negative for nausea and vomiting.  Genitourinary: Negative for dysuria.  Musculoskeletal: Negative for joint swelling.  Skin: Negative for rash.  Neurological: Positive for headaches.  Hematological: Does not bruise/bleed easily.  Psychiatric/Behavioral: Negative for dysphoric mood. The patient is not nervous/anxious.        Objective:   Physical Exam Overweight female in no acute distress Nose without purulence or discharge noted Neck without lymphadenopathy or thyromegaly Chest with mildly decreased breath sounds, otherwise clear Neck exam with regular rate and rhythm Lower extremities with no significant edema, no cyanosis Alert and oriented, moves all 4 extremities.       Assessment & Plan:

## 2014-05-30 NOTE — Assessment & Plan Note (Signed)
The patient is doing fairly well from a COPD standpoint. She has lost 20 pounds since the last visit, and has found the combination of Spiriva and Symbicort to work well for her. She is staying on her oxygen with heavy her exertional activities, and also during sleep. I have commended her on her weight loss, and asked her to continue on her current regimen. I will see her back in 6 months to check on things.

## 2014-08-06 ENCOUNTER — Other Ambulatory Visit: Payer: Self-pay | Admitting: Pulmonary Disease

## 2014-09-13 ENCOUNTER — Other Ambulatory Visit: Payer: Self-pay | Admitting: Pulmonary Disease

## 2014-11-01 ENCOUNTER — Encounter: Payer: Self-pay | Admitting: Gastroenterology

## 2014-11-29 ENCOUNTER — Ambulatory Visit: Payer: Medicare Other | Admitting: Pulmonary Disease

## 2014-12-06 ENCOUNTER — Ambulatory Visit (INDEPENDENT_AMBULATORY_CARE_PROVIDER_SITE_OTHER): Payer: Medicare Other | Admitting: Pulmonary Disease

## 2014-12-06 ENCOUNTER — Encounter: Payer: Self-pay | Admitting: Pulmonary Disease

## 2014-12-06 ENCOUNTER — Ambulatory Visit (INDEPENDENT_AMBULATORY_CARE_PROVIDER_SITE_OTHER)
Admission: RE | Admit: 2014-12-06 | Discharge: 2014-12-06 | Disposition: A | Discharge status: Home / Self Care | Payer: Medicare Other | Source: Ambulatory Visit | Attending: Pulmonary Disease | Admitting: Pulmonary Disease

## 2014-12-06 VITALS — BP 118/70 | HR 60 | Temp 97.0°F | Ht 65.5 in | Wt 191.8 lb

## 2014-12-06 DIAGNOSIS — J438 Other emphysema: Secondary | ICD-10-CM

## 2014-12-06 NOTE — Patient Instructions (Addendum)
No change in breathing medications.  Keep working on weight loss.  You are doing very well. Will send a note to your primary care physician recommending a cardiac evaluation for your shortness of breath. Will check cxr today, and call you with results. followup with Dr. Delton Coombes in 14mos.

## 2014-12-06 NOTE — Progress Notes (Signed)
   Subjective:    Patient ID: Stephanie Buck, female    DOB: January 09, 1956, 59 y.o.   MRN: [id number]  HPI The patient comes in today for follow-up of her known COPD. She feels that she is doing well on her current inhaler regimen, and she has lost an additional 13 pounds through a conditioning program. Despite this, she continues to have dyspnea on exertion and is concerned that it is not related to her lung disease. She does have a family history of cardiac disease, and has not had a recent evaluation. She is continuing to wear her oxygen with heavier exertional activities, and has been keeping a close eye on her saturations.   Review of Systems  Constitutional: Negative for fever, chills and unexpected weight change.  HENT: Negative for congestion, dental problem, ear pain, nosebleeds, postnasal drip, rhinorrhea, sinus pressure, sneezing, sore throat, trouble swallowing and voice change.   Eyes: Negative for redness, itching and visual disturbance.  Respiratory: Positive for shortness of breath. Negative for cough, choking, chest tightness and wheezing.   Cardiovascular: Negative for chest pain, palpitations and leg swelling.  Gastrointestinal: Negative for nausea, vomiting, abdominal pain and diarrhea.  Genitourinary: Negative for dysuria and difficulty urinating.  Musculoskeletal: Negative for joint swelling and arthralgias.  Skin: Negative for rash.  Neurological: Negative for tremors, syncope and headaches.  Hematological: Does not bruise/bleed easily.  Psychiatric/Behavioral: Negative for dysphoric mood. The patient is not nervous/anxious.        Objective:   Physical Exam Overweight female in no acute distress Nose without purulence or discharge noted Neck without lymphadenopathy or thyromegaly Chest with mildly decreased breath sounds, otherwise clear Cardiac exam with regular rate and rhythm, 2/6 systolic murmur Lower extremities with no edema, no cyanosis Alert and oriented,  moves all 4 extremities.        Assessment & Plan:

## 2014-12-06 NOTE — Assessment & Plan Note (Signed)
The patient feels that she is doing well from a COPD standpoint, and continues to work on conditioning and is still losing weight successfully. This, she is having significant dyspnea on exertion that she thinks may be a little worse from baseline. She has not had a recent cardiac evaluation, and I suspect that she needs a stress test and echocardiogram for completeness. We will check a chest x-ray today. The patient does have desaturation with heavier exertional activities, and she wears the oxygen when she is doing these types of activities.

## 2014-12-12 ENCOUNTER — Other Ambulatory Visit: Payer: Self-pay | Admitting: *Deleted

## 2014-12-12 MED ORDER — TIOTROPIUM BROMIDE MONOHYDRATE 2.5 MCG/ACT IN AERS: INHALATION_SPRAY | RESPIRATORY_TRACT | Status: DC

## 2014-12-19 ENCOUNTER — Telehealth: Payer: Self-pay | Admitting: *Deleted

## 2014-12-19 NOTE — Telephone Encounter (Signed)
According to chart, pt does wear oxygen PRN during the daytime and 2 liters at bedtime.  I LMOM for her to call back so that I can set her up with an OV d/t her wearing oxygen and not being able to be done at the [facility]

## 2014-12-24 ENCOUNTER — Telehealth: Payer: Self-pay | Admitting: Cardiovascular Disease

## 2014-12-24 NOTE — Telephone Encounter (Signed)
[facility] Dr Allyson SabalBerry.  [facility] Dr Hazle CocaBerry's schedule on 01/04/15. lp

## 2014-12-26 ENCOUNTER — Emergency Department (HOSPITAL_COMMUNITY): Payer: Medicare Other

## 2014-12-26 ENCOUNTER — Emergency Department (HOSPITAL_COMMUNITY)
Admission: EM | Admit: 2014-12-26 | Discharge: 2014-12-26 | Disposition: A | Discharge status: Home / Self Care | Payer: Medicare Other | Attending: Emergency Medicine | Admitting: Emergency Medicine

## 2014-12-26 ENCOUNTER — Encounter (HOSPITAL_COMMUNITY): Payer: Self-pay | Admitting: Emergency Medicine

## 2014-12-26 DIAGNOSIS — I10 Essential (primary) hypertension: Secondary | ICD-10-CM | POA: Diagnosis not present

## 2014-12-26 DIAGNOSIS — Z859 Personal history of malignant neoplasm, unspecified: Secondary | ICD-10-CM | POA: Diagnosis not present

## 2014-12-26 DIAGNOSIS — R1031 Right lower quadrant pain: Secondary | ICD-10-CM | POA: Diagnosis not present

## 2014-12-26 DIAGNOSIS — Z87891 Personal history of nicotine dependence: Secondary | ICD-10-CM | POA: Diagnosis not present

## 2014-12-26 DIAGNOSIS — E039 Hypothyroidism, unspecified: Secondary | ICD-10-CM | POA: Insufficient documentation

## 2014-12-26 DIAGNOSIS — Z79899 Other long term (current) drug therapy: Secondary | ICD-10-CM | POA: Insufficient documentation

## 2014-12-26 DIAGNOSIS — E785 Hyperlipidemia, unspecified: Secondary | ICD-10-CM | POA: Insufficient documentation

## 2014-12-26 DIAGNOSIS — Z7951 Long term (current) use of inhaled steroids: Secondary | ICD-10-CM | POA: Insufficient documentation

## 2014-12-26 DIAGNOSIS — R0602 Shortness of breath: Secondary | ICD-10-CM

## 2014-12-26 DIAGNOSIS — J441 Chronic obstructive pulmonary disease with (acute) exacerbation: Secondary | ICD-10-CM | POA: Insufficient documentation

## 2014-12-26 HISTORY — DX: Disorder of thyroid, unspecified: E07.9

## 2014-12-26 HISTORY — DX: Hypothyroidism, unspecified: E03.9

## 2014-12-26 LAB — URINALYSIS, ROUTINE W REFLEX MICROSCOPIC
BILIRUBIN URINE: NEGATIVE
GLUCOSE, UA: NEGATIVE mg/dL
Hgb urine dipstick: NEGATIVE
Ketones, ur: NEGATIVE mg/dL
Leukocytes, UA: NEGATIVE
Nitrite: NEGATIVE
PH: 7 (ref 5.0–8.0)
Protein, ur: NEGATIVE mg/dL
Specific Gravity, Urine: 1.005 (ref 1.005–1.030)
Urobilinogen, UA: 0.2 mg/dL (ref 0.0–1.0)

## 2014-12-26 LAB — CBC WITH DIFFERENTIAL/PLATELET
Basophils Absolute: 0 10*3/uL (ref 0.0–0.1)
Basophils Relative: 0 % (ref 0–1)
Eosinophils Absolute: 0.3 10*3/uL (ref 0.0–0.7)
Eosinophils Relative: 4 % (ref 0–5)
HCT: 41.6 % (ref 36.0–46.0)
Hemoglobin: 13.5 g/dL (ref 12.0–15.0)
LYMPHS ABS: 1.8 10*3/uL (ref 0.7–4.0)
Lymphocytes Relative: 23 % (ref 12–46)
MCH: 29.7 pg (ref 26.0–34.0)
MCHC: 32.5 g/dL (ref 30.0–36.0)
MCV: 91.6 fL (ref 78.0–100.0)
MONO ABS: 0.8 10*3/uL (ref 0.1–1.0)
MONOS PCT: 10 % (ref 3–12)
Neutro Abs: 5 10*3/uL (ref 1.7–7.7)
Neutrophils Relative %: 63 % (ref 43–77)
Platelets: 294 10*3/uL (ref 150–400)
RBC: 4.54 MIL/uL (ref 3.87–5.11)
RDW: 13.5 % (ref 11.5–15.5)
WBC: 7.9 10*3/uL (ref 4.0–10.5)

## 2014-12-26 LAB — COMPREHENSIVE METABOLIC PANEL
ALBUMIN: 3.6 g/dL (ref 3.5–5.0)
ALT: 19 U/L (ref 14–54)
AST: 26 U/L (ref 15–41)
Alkaline Phosphatase: 106 U/L (ref 38–126)
Anion gap: 8 (ref 5–15)
BUN: 11 mg/dL (ref 6–20)
CALCIUM: 9.1 mg/dL (ref 8.9–10.3)
CHLORIDE: 98 mmol/L — AB (ref 101–111)
CO2: 29 mmol/L (ref 22–32)
Creatinine, Ser: 0.68 mg/dL (ref 0.44–1.00)
GFR calc Af Amer: >60 mL/min (ref >60)
GFR calc non Af Amer: >60 mL/min (ref >60)
Glucose, Bld: 149 mg/dL — ABNORMAL HIGH (ref 65–99)
Potassium: 4.3 mmol/L (ref 3.5–5.1)
SODIUM: 135 mmol/L (ref 135–145)
Total Bilirubin: 0.3 mg/dL (ref 0.3–1.2)
Total Protein: 6.9 g/dL (ref 6.5–8.1)

## 2014-12-26 LAB — BRAIN NATRIURETIC PEPTIDE: B Natriuretic Peptide: 105.7 pg/mL — ABNORMAL HIGH (ref 0.0–100.0)

## 2014-12-26 LAB — I-STAT TROPONIN, ED: Troponin i, poc: 0 ng/mL (ref 0.00–0.08)

## 2014-12-26 LAB — TROPONIN I

## 2014-12-26 MED ORDER — IOHEXOL 300 MG/ML  SOLN
25.0000 mL | Freq: Once | INTRAMUSCULAR | Status: AC | PRN
  Administered 2014-12-26: 25 mL via ORAL

## 2014-12-26 MED ORDER — SODIUM CHLORIDE 0.9 % IV BOLUS (SEPSIS)
500.0000 mL | Freq: Once | INTRAVENOUS | Status: AC
  Administered 2014-12-26: 500 mL via INTRAVENOUS

## 2014-12-26 MED ORDER — IOHEXOL 300 MG/ML  SOLN
100.0000 mL | Freq: Once | INTRAMUSCULAR | Status: AC | PRN
  Administered 2014-12-26: 100 mL via INTRAVENOUS

## 2014-12-26 MED ORDER — IPRATROPIUM-ALBUTEROL 0.5-2.5 (3) MG/3ML IN SOLN
3.0000 mL | Freq: Once | RESPIRATORY_TRACT | Status: AC
  Administered 2014-12-26: 3 mL via RESPIRATORY_TRACT
  Filled 2014-12-26: qty 3

## 2014-12-26 NOTE — ED Provider Notes (Signed)
CSN: [id number]     Arrival date & time 12/26/14  4098 History   First MD Initiated Contact with Patient 12/26/14 [phone]     Chief Complaint  Patient presents with  . Shortness of Breath     (Consider location/radiation/quality/duration/timing/severity/associated sxs/prior Treatment) The history is provided by the patient and medical records.    ] Pt with hx HTN, HLD, COPD, asthma p/w worsening SOB x 1 month that is worse with any exertion and with lying flat but also at baseline, occasional CP that is intermittent, "crushing" 9/10 intensity, lasts 1-2 minutes, seems to be random.  These symptoms have been ongoing for the past month.   Associated lightheadedness, dizziness, constipation, diarrhea, urinary frequency, weight gain and loss for days at a time, intermittent swelling throughout her body.  All of these symptoms have been ongoing and worsening x 1 month.  Has seen her pulmonologist who has referred her to cardiology, has an appt with Dr Allyson Sabal July 8.    Past Medical History  Diagnosis Date  . COPD (chronic obstructive pulmonary disease)   . Hyperlipidemia   . Asthma   . Hypertension   . Cancer   . Thyroid disease   . Hypothyroid    Past Surgical History  Procedure Laterality Date  . Cervical disc surgery    . Abdominal hysterectomy    . Breast surgery     Family History  Problem Relation Age of Onset  . Coronary artery disease Mother   . Stroke Father   . Prostate cancer Father   . Heart disease Brother   . Emphysema Brother    History  Substance Use Topics  . Smoking status: Former Smoker -- 1.50 packs/day for 41 years    Types: Cigarettes    Quit date: 11/30/2011  . Smokeless tobacco: Not on file  . Alcohol Use: No   OB History    No data available     Review of Systems  All other systems reviewed and are negative.     Allergies  Clindamycin/lincomycin and Trazodone and nefazodone  Home Medications   Prior to Admission medications   Medication  Sig Start Date End Date Taking? Authorizing Provider  albuterol (PROVENTIL HFA;VENTOLIN HFA) 108 (90 BASE) MCG/ACT inhaler Inhale 2 puffs into the lungs every 6 (six) hours as needed. For shortness of breath.   Yes Historical Provider, MD  ALPRAZolam Prudy Feeler) 1 MG tablet Take 1 mg by mouth 3 (three) times daily as needed for anxiety.    Yes Historical Provider, MD  amitriptyline (ELAVIL) 75 MG tablet Take 75 mg by mouth daily. 12/14/14  Yes Historical Provider, MD  atenolol (TENORMIN) 50 MG tablet Take 1 tablet by mouth every morning. 05/04/13  Yes Historical Provider, MD  citalopram (CELEXA) 20 MG tablet Take 1 tablet by mouth daily. 11/14/13  Yes Historical Provider, MD  CRESTOR 10 MG tablet Take 1 tablet by mouth daily. 05/10/13  Yes Historical Provider, MD  furosemide (LASIX) 40 MG tablet Take 1 tablet by mouth daily. 11/02/13  Yes Historical Provider, MD  hydrochlorothiazide (HYDRODIURIL) 25 MG tablet Take 1 tablet by mouth daily. 04/27/13  Yes Historical Provider, MD  levothyroxine (SYNTHROID, LEVOTHROID) 100 MCG tablet Take 100 mcg by mouth daily. 12/11/14  Yes Historical Provider, MD  naproxen sodium (ANAPROX) 220 MG tablet Take 220 mg by mouth 2 (two) times daily as needed (pain).   Yes Historical Provider, MD  omeprazole (PRILOSEC) 20 MG capsule Take 1 capsule by mouth daily. 11/02/13  Yes Historical Provider, MD  oxyCODONE-acetaminophen (PERCOCET) 5-325 MG per tablet Take 1 tablet by mouth every 4 (four) hours as needed. For pain. 12/03/11  Yes Tora Kindred York, PA-C  promethazine (PHENERGAN) 25 MG tablet Take 25 mg by mouth every 4 (four) hours as needed for nausea.  10/22/14  Yes Historical Provider, MD  SYMBICORT 160-4.5 MCG/ACT inhaler INHALE 2 PUFFS TWICE DAILY 09/13/14  Yes Barbaraann Share, MD  Tiotropium Bromide Monohydrate (SPIRIVA RESPIMAT) 2.5 MCG/ACT AERS INHALE 2 PUFFS ONCE DAILY 12/12/14  Yes Tammy S Parrett, NP   BP 99/67 mmHg  Pulse 55  Temp(Src) 98.3 F (36.8 C) (Oral)  Resp 20  Ht   (1.651 m)  Wt 205 lb 11.2 oz (93.305 kg)  BMI 34.23 kg/m2  SpO2 99% Physical Exam  Constitutional: She appears well-developed and well-nourished. No distress.  HENT:  Head: Normocephalic and atraumatic.  Neck: Neck supple.  Cardiovascular: Normal rate and regular rhythm.   Pulmonary/Chest: Effort normal and breath sounds normal. No accessory muscle usage. No tachypnea. No respiratory distress. She has no decreased breath sounds. She has no wheezes. She has no rhonchi. She has no rales.  Abdominal: Soft. She exhibits no distension. There is tenderness in the right lower quadrant. There is no rebound and no guarding.  Musculoskeletal: She exhibits no edema.  Neurological: She is alert.  Skin: She is not diaphoretic.  Psychiatric: She has a normal mood and affect. Her behavior is normal.  Nursing note and vitals reviewed.   ED Course  Procedures (including critical care time) Labs Review Labs Reviewed  COMPREHENSIVE METABOLIC PANEL - Abnormal; Notable for the following:    Chloride 98 (*)    Glucose, Bld 149 (*)    All other components within normal limits  BRAIN NATRIURETIC PEPTIDE - Abnormal; Notable for the following:    B Natriuretic Peptide 105.7 (*)    All other components within normal limits  URINE CULTURE  CBC WITH DIFFERENTIAL/PLATELET  TROPONIN I  URINALYSIS, ROUTINE W REFLEX MICROSCOPIC (NOT AT [facility])  Rosezena Sensor, ED    Imaging Review Dg Chest 2 View  12/26/2014   CLINICAL DATA:  Progressive shortness of breath.  EXAM: CHEST  2 VIEW  COMPARISON:  12/06/2014 and 09/21/2013  FINDINGS: The heart size and mediastinal contours are within normal limits. Both lungs are clear except for slight chronic accentuation of the interstitial markings at the bases. No effusions. The visualized skeletal structures are unremarkable.  IMPRESSION: No acute abnormalities.   Electronically Signed   By: Francene Boyers M.D.   On: 12/26/2014 09:23   Ct Abdomen Pelvis W  Contrast  12/26/2014   CLINICAL DATA:  Right lower quadrant pain. History of uterine cancer. Shortness of breath.  EXAM: CT ABDOMEN AND PELVIS WITH CONTRAST  TECHNIQUE: Multidetector CT imaging of the abdomen and pelvis was performed using the standard protocol following bolus administration of intravenous contrast.  CONTRAST:  OMNIPAQUE IOHEXOL 300 MG/ML  SOLN  COMPARISON:  CT 01/31/2012  FINDINGS: Air-filled lungs cyst noted within the right lower lobe, stable since prior study. No effusions. Heart is normal size.  Low-density lesion in the liver adjacent to the IVC measures 2.0 cm compared with 2.8 cm previously. There is suggestion of peripheral contrast enhancement suggesting this most likely reflects a hemangioma. No other focal hepatic abnormality. Gallbladder, spleen, pancreas, adrenals and kidneys are unremarkable.  Stomach, large and small bowel are unremarkable. Appendix is visualized and is normal. Aorta is normal caliber with scattered  calcifications. No free fluid, free air or adenopathy.  Prior hysterectomy. No adnexal masses. Urinary bladder is unremarkable.  No acute bony abnormality or focal bone lesion. Degenerative changes in the lower lumbar spine.  IMPRESSION: Normal appendix.  No acute findings in the abdomen or pelvis.  Decreasing size of the right hepatic lesion. I favor this represents a hemangioma.  Stable air-filled cyst in the right lower lobe.   Electronically Signed   By: Charlett NoseKevin  Dover M.D.   On: 12/26/2014 14:15     EKG Interpretation   Date/Time:  Wednesday December 26 2014 08:47:25 EDT Ventricular Rate:  52 PR Interval:  152 QRS Duration: 84 QT Interval:  452 QTC Calculation: 420 R Axis:   72 Text Interpretation:  Sinus rhythm Baseline wander in lead(s) II III aVF  No significant change since last tracing Confirmed by Mirian MoGentry, Matthew  [phone]([id number]) on 12/26/2014 11:51:03 AM       10:30 AM Discussed pt with Dr Littie DeedsGentry who will also see patient.    MDM   Final  diagnoses:  SOB (shortness of breath)    Afebrile, nontoxic patient with worsening SOB and occasional CP over 1 month, also with a variety of other symptoms.  Workup largely unremarkable.  She is well appearing,  O2 sats normal on her home O2.  Pt also seen and examined by Dr Littie DeedsGentry who agrees with workup and plan.  D/C home with closer follow up with cardiology than pt previously had, I spoke with Rosann Auerbachrish (cardmaster) who will have the practice call her with a sooner appointment.   Discussed result, findings, treatment, and follow up  with patient.  Pt given return precautions.  Pt verbalizes understanding and agrees with plan.         Trixie Dredgemily Earmon Sherrow, PA-C 12/26/14 1709  Mirian MoMatthew Gentry, MD 12/27/14 [phone]

## 2014-12-26 NOTE — ED Notes (Signed)
Patient transported to CT without distress 

## 2014-12-26 NOTE — ED Notes (Signed)
Pt states she has been feeling SOB for several weeks and having intermittent CP for about a month. Pt wears 2L [place] for the past 3 weeks at home. Pt states she feels swollen all over.

## 2014-12-26 NOTE — Discharge Instructions (Signed)
Read the information below.  You may return to the Emergency Department at any time for worsening condition or any new symptoms that concern you.  If you develop worsening chest pain, shortness of breath, fever, you pass out, or become weak or dizzy, return to the ER for a recheck.    ° ° °Shortness of Breath °Shortness of breath means you have trouble breathing. It could also mean that you have a medical problem. You should get immediate medical care for shortness of breath. °CAUSES  °· Not enough oxygen in the air such as with high altitudes or a smoke-filled room. °· Certain lung diseases, infections, or problems. °· Heart disease or conditions, such as angina or heart failure. °· Low red blood cells (anemia). °· Poor physical fitness, which can cause shortness of breath when you exercise. °· Chest or back injuries or stiffness. °· Being overweight. °· Smoking. °· Anxiety, which can make you feel like you are not getting enough air. °DIAGNOSIS  °Serious medical problems can often be found during your physical exam. Tests may also be done to determine why you are having shortness of breath. Tests may include: °· Chest X-rays. °· Lung function tests. °· Blood tests. °· An electrocardiogram (ECG). °· An ambulatory electrocardiogram. An ambulatory ECG records your heartbeat patterns over a 24-hour period. °· Exercise testing. °· A transthoracic echocardiogram (TTE). During echocardiography, sound waves are used to evaluate how blood flows through your heart. °· A transesophageal echocardiogram (TEE). °· Imaging scans. °Your health care provider may not be able to find a cause for your shortness of breath after your exam. In this case, it is important to have a follow-up exam with your health care provider as directed.  °TREATMENT  °Treatment for shortness of breath depends on the cause of your symptoms and can vary greatly. °HOME CARE INSTRUCTIONS  °· Do not smoke. Smoking is a common cause of shortness of breath. If  you smoke, ask for help to quit. °· Avoid being around chemicals or things that may bother your breathing, such as paint fumes and dust. °· Rest as needed. Slowly resume your usual activities. °· If medicines were prescribed, take them as directed for the full length of time directed. This includes oxygen and any inhaled medicines. °· Keep all follow-up appointments as directed by your health care provider. °SEEK MEDICAL CARE IF:  °· Your condition does not improve in the time expected. °· You have a hard time doing your normal activities even with rest. °· You have any new symptoms. °SEEK IMMEDIATE MEDICAL CARE IF:  °· Your shortness of breath gets worse. °· You feel light-headed, faint, or develop a cough not controlled with medicines. °· You start coughing up blood. °· You have pain with breathing. °· You have chest pain or pain in your arms, shoulders, or abdomen. °· You have a fever. °· You are unable to walk up stairs or exercise the way you normally do. °MAKE SURE YOU: °· Understand these instructions. °· Will watch your condition. °· Will get help right away if you are not doing well or get worse. °Document Released: 03/10/2001 Document Revised: 06/20/2013 Document Reviewed: 08/31/2011 °ExitCare® Patient Information ©2015 [employer]. This information is not intended to replace advice given to you by your health care provider. Make sure you discuss any questions you have with your health care provider. ° °

## 2014-12-26 NOTE — ED Notes (Signed)
CT called and made aware to come get pt.

## 2014-12-26 NOTE — ED Notes (Signed)
MD Littie DeedsGentry at bedside assessing pt.

## 2014-12-27 LAB — URINE CULTURE

## 2015-01-04 ENCOUNTER — Encounter: Payer: Self-pay | Admitting: Gastroenterology

## 2015-01-04 ENCOUNTER — Encounter: Payer: Self-pay | Admitting: Cardiovascular Disease

## 2015-01-04 ENCOUNTER — Ambulatory Visit
Admission: RE | Admit: 2015-01-04 | Discharge: 2015-01-04 | Disposition: A | Discharge status: Home / Self Care | Payer: Medicare Other | Source: Ambulatory Visit | Attending: Cardiovascular Disease | Admitting: Cardiovascular Disease

## 2015-01-04 ENCOUNTER — Ambulatory Visit (INDEPENDENT_AMBULATORY_CARE_PROVIDER_SITE_OTHER): Payer: Medicare Other | Admitting: Cardiovascular Disease

## 2015-01-04 DIAGNOSIS — Z01818 Encounter for other preprocedural examination: Secondary | ICD-10-CM

## 2015-01-04 DIAGNOSIS — I10 Essential (primary) hypertension: Secondary | ICD-10-CM | POA: Insufficient documentation

## 2015-01-04 DIAGNOSIS — R0602 Shortness of breath: Secondary | ICD-10-CM

## 2015-01-04 DIAGNOSIS — D689 Coagulation defect, unspecified: Secondary | ICD-10-CM

## 2015-01-04 DIAGNOSIS — R5381 Other malaise: Secondary | ICD-10-CM

## 2015-01-04 DIAGNOSIS — R079 Chest pain, unspecified: Secondary | ICD-10-CM

## 2015-01-04 DIAGNOSIS — R5383 Other fatigue: Secondary | ICD-10-CM

## 2015-01-04 NOTE — Patient Instructions (Addendum)
Dr Allyson SabalBerry has requested that you have a cardiac catheterization on Thursday, 01/10/15. Cardiac catheterization is used to diagnose and/or treat various heart conditions. Doctors may recommend this procedure for a number of different reasons. The most common reason is to evaluate chest pain. Chest pain can be a symptom of coronary artery disease (CAD), and cardiac catheterization can show whether plaque is narrowing or blocking your heart's arteries. This procedure is also used to evaluate the valves, as well as measure the blood flow and oxygen levels in different parts of your heart. For further information please visit [email or web address]. Please follow instruction sheet, as given.  Dr Allyson SabalBerry has ordered you to have some blood work/chest xray prior to your procedure. The scheduler will tell you when to get this done. There is [facility] lab on the first floor of our building and one on [address] ([address]. Phone - [phone]) [facility] ([address])  Your physician has requested that you have an echocardiogram on Monday, 01/07/15. Echocardiography is a painless test that uses sound waves to create images of your heart. It provides your doctor with information about the size and shape of your heart and how well your heart's chambers and valves are working. This procedure takes approximately one hour. There are no restrictions for this procedure.  Dr Allyson SabalBerry has recommended making the following medication changes: START Aspirin 81 mg - take 1 tablet daily.     right radial

## 2015-01-04 NOTE — Progress Notes (Signed)
01/04/2015 Dimas ChyleRegina Colaizzi   1956-01-25  [id number]  Primary Physician Quitman LivingsHASSAN,SAMI, MD Primary Cardiologist: Runell GessJonathan J. Becca Bayne MD Roseanne RenoFACP,FACC,FAHA, FSCAI   HPI:  [name] is a pleasant 59 year old mildly overweight married Caucasian female mother of one child, grandmother and 4 grandchildren is accompanied by her husband [name] today. She was referred for significant change in her shortness of breath over the last 2-3 months now oxygen dependent continuously with new onset chest pain. Her chronic risk factor profile is notable for 80 pack years of tobacco having quit 4 years ago, treated hypertension, hypokalemia and family history. Her brother has had coronary artery bypass grafting 3 times as well as stents. Her mother had a myocardial infarction. She does have a history of COPD. She has never had a heart attack or stroke. She was on daily at bedtime oxygen for years until 2 months ago when she had sudden change in the severity of her shortness of breath. She saw her pulmonologist who do not feel this was pulmonary in nature. She recently was seen in the ER for chest pain and shortness of breath and ruled out for myocardial infarction. Her EKG showed no acute changes.   Current Outpatient Prescriptions  Medication Sig Dispense Refill  . albuterol (PROVENTIL HFA;VENTOLIN HFA) 108 (90 BASE) MCG/ACT inhaler Inhale 2 puffs into the lungs every 6 (six) hours as needed. For shortness of breath.    . ALPRAZolam (XANAX) 1 MG tablet Take 1 mg by mouth 3 (three) times daily as needed for anxiety.     Marland Kitchen. atenolol (TENORMIN) 50 MG tablet Take 1 tablet by mouth every morning.    . citalopram (CELEXA) 20 MG tablet Take 1 tablet by mouth daily.    . CRESTOR 10 MG tablet Take 1 tablet by mouth daily.    . furosemide (LASIX) 40 MG tablet Take 1 tablet by mouth daily.    . hydrochlorothiazide (HYDRODIURIL) 25 MG tablet Take 1 tablet by mouth daily.    Marland Kitchen. levothyroxine (SYNTHROID, LEVOTHROID) 100 MCG tablet Take  100 mcg by mouth daily.    Marland Kitchen. omeprazole (PRILOSEC) 20 MG capsule Take 1 capsule by mouth daily.    Marland Kitchen. oxyCODONE-acetaminophen (PERCOCET) 5-325 MG per tablet Take 1 tablet by mouth every 4 (four) hours as needed. For pain. 30 tablet 0  . promethazine (PHENERGAN) 25 MG tablet Take 25 mg by mouth every 4 (four) hours as needed for nausea.     . SYMBICORT 160-4.5 MCG/ACT inhaler INHALE 2 PUFFS TWICE DAILY 10.2 g 5  . Tiotropium Bromide Monohydrate (SPIRIVA RESPIMAT) 2.5 MCG/ACT AERS INHALE 2 PUFFS ONCE DAILY 4 g 3   No current facility-administered medications for this visit.    Allergies  Allergen Reactions  . Clindamycin/Lincomycin Other (See Comments)    Causes patient to get C Diff.   . Trazodone And Nefazodone     History   Social History  . Marital Status: Single    Spouse Name: N/A  . Number of Children: N/A  . Years of Education: N/A   Occupational History  . DISABLED    Social History Main Topics  . Smoking status: Former Smoker -- 1.50 packs/day for 41 years    Types: Cigarettes    Quit date: 11/30/2011  . Smokeless tobacco: Not on file  . Alcohol Use: No  . Drug Use: No  . Sexual Activity: Not on file   Other Topics Concern  . Not on file   Social History Narrative  Review of Systems: General: negative for chills, fever, night sweats or weight changes.  Cardiovascular: negative for chest pain, dyspnea on exertion, edema, orthopnea, palpitations, paroxysmal nocturnal dyspnea or shortness of breath Dermatological: negative for rash Respiratory: negative for cough or wheezing Urologic: negative for hematuria Abdominal: negative for nausea, vomiting, diarrhea, bright red blood per rectum, melena, or hematemesis Neurologic: negative for visual changes, syncope, or dizziness All other systems reviewed and are otherwise negative except as noted above.    Blood pressure 110/68, pulse 58, height 5\' 6"  (1.676 m), weight 211 lb 12.8 oz (96.072 kg).  General  appearance: alert and no distress Neck: no adenopathy, no carotid bruit, no JVD, supple, symmetrical, trachea midline and thyroid not enlarged, symmetric, no tenderness/mass/nodules Lungs: clear to auscultation bilaterally Heart: regular rate and rhythm, S1, S2 normal, no murmur, click, rub or gallop Extremities: extremities normal, atraumatic, no cyanosis or edema  EKG not performed today  ASSESSMENT AND PLAN:   Hyperlipidemia History of hyperlipidemia on Crestor 10 mg a day followed by her PCP  Essential hypertension History of hypertension on beta blocker with blood pressure measured at 110/68. Continue current meds at current dosing  Chest pain History of increased shortness of breath over the last 2-3 months with chest pain over the last month. Cardiac risk factor profile is positive for greater than 80 pack years tobacco, treated hypertension, hyperlipidemia and a strong family history for heart disease. I think that her prior probability of CAD is high and therefore I'm going to bypass. Functional study and performed cardiac catheterization next week via the right radial approach. I'm also going to get a 2-D echo with LV function and valvular abnormality. I thoroughly discussed with and benefits the patient wishes to proceed.      Runell GessJonathan J. Renita Brocks MD FACP,FACC,FAHA, [facility] 01/04/2015 8:34 AM

## 2015-01-04 NOTE — Assessment & Plan Note (Signed)
History of hyperlipidemia on Crestor 10 mg a day followed by her PCP 

## 2015-01-04 NOTE — Assessment & Plan Note (Signed)
History of increased shortness of breath over the last 2-3 months with chest pain over the last month. Cardiac risk factor profile is positive for greater than 80 pack years tobacco, treated hypertension, hyperlipidemia and a strong family history for heart disease. I think that her prior probability of CAD is high and therefore I'm going to bypass. Functional study and performed cardiac catheterization next week via the right radial approach. I'm also going to get a 2-D echo with LV function and valvular abnormality. I thoroughly discussed with and benefits the patient wishes to proceed.

## 2015-01-04 NOTE — Assessment & Plan Note (Signed)
History of hypertension on beta blocker with blood pressure measured at 110/68. Continue current meds at current dosing

## 2015-01-05 LAB — CBC
HCT: 38.3 % (ref 36.0–46.0)
Hemoglobin: 12.2 g/dL (ref 12.0–15.0)
MCH: 29.2 pg (ref 26.0–34.0)
MCHC: 31.9 g/dL (ref 30.0–36.0)
MCV: 91.6 fL (ref 78.0–100.0)
MPV: 10.3 fL (ref 8.6–12.4)
PLATELETS: 301 10*3/uL (ref 150–400)
RBC: 4.18 MIL/uL (ref 3.87–5.11)
RDW: 14.5 % (ref 11.5–15.5)
WBC: 6.7 10*3/uL (ref 4.0–10.5)

## 2015-01-05 LAB — BASIC METABOLIC PANEL
BUN: 14 mg/dL (ref 6–23)
CHLORIDE: 96 meq/L (ref 96–112)
CO2: 35 meq/L — AB (ref 19–32)
CREATININE: 0.71 mg/dL (ref 0.50–1.10)
Calcium: 9.4 mg/dL (ref 8.4–10.5)
Glucose, Bld: 94 mg/dL (ref 70–99)
POTASSIUM: 4.6 meq/L (ref 3.5–5.3)
Sodium: 141 mEq/L (ref 135–145)

## 2015-01-05 LAB — PROTIME-INR
INR: 0.93 (ref <1.50)
Prothrombin Time: 12.5 seconds (ref 11.6–15.2)

## 2015-01-05 LAB — APTT: aPTT: 27 seconds (ref 24–37)

## 2015-01-05 LAB — TSH: TSH: 2.142 u[IU]/mL (ref 0.350–4.500)

## 2015-01-07 ENCOUNTER — Ambulatory Visit (INDEPENDENT_AMBULATORY_CARE_PROVIDER_SITE_OTHER): Payer: Medicare Other | Admitting: Internal Medicine

## 2015-01-07 ENCOUNTER — Encounter: Payer: Medicaid Other | Admitting: Gastroenterology

## 2015-01-07 ENCOUNTER — Telehealth: Payer: Self-pay | Admitting: Pulmonary Disease

## 2015-01-07 ENCOUNTER — Encounter: Payer: Self-pay | Admitting: Internal Medicine

## 2015-01-07 ENCOUNTER — Ambulatory Visit (HOSPITAL_COMMUNITY)
Admission: RE | Admit: 2015-01-07 | Discharge: 2015-01-07 | Disposition: A | Discharge status: Home / Self Care | Payer: Medicare Other | Source: Ambulatory Visit | Attending: Cardiovascular Disease | Admitting: Cardiovascular Disease

## 2015-01-07 VITALS — BP 94/60 | HR 65 | Ht 65.5 in | Wt 208.0 lb

## 2015-01-07 DIAGNOSIS — I071 Rheumatic tricuspid insufficiency: Secondary | ICD-10-CM | POA: Insufficient documentation

## 2015-01-07 DIAGNOSIS — J9612 Chronic respiratory failure with hypercapnia: Secondary | ICD-10-CM

## 2015-01-07 DIAGNOSIS — R0600 Dyspnea, unspecified: Secondary | ICD-10-CM | POA: Diagnosis present

## 2015-01-07 DIAGNOSIS — I340 Nonrheumatic mitral (valve) insufficiency: Secondary | ICD-10-CM | POA: Insufficient documentation

## 2015-01-07 DIAGNOSIS — R079 Chest pain, unspecified: Secondary | ICD-10-CM | POA: Diagnosis not present

## 2015-01-07 DIAGNOSIS — R0602 Shortness of breath: Secondary | ICD-10-CM

## 2015-01-07 DIAGNOSIS — J449 Chronic obstructive pulmonary disease, unspecified: Secondary | ICD-10-CM

## 2015-01-07 MED ORDER — NITROGLYCERIN 0.3 MG SL SUBL: 0.3000 mg | SUBLINGUAL_TABLET | SUBLINGUAL | Status: AC | PRN

## 2015-01-07 MED ORDER — FAMOTIDINE 20 MG PO TABS: ORAL_TABLET | ORAL | Status: DC

## 2015-01-07 MED ORDER — PANTOPRAZOLE SODIUM 40 MG PO TBEC: 40.0000 mg | DELAYED_RELEASE_TABLET | Freq: Two times a day (BID) | ORAL | Status: DC

## 2015-01-07 NOTE — Telephone Encounter (Signed)
Pt returned call - [phone]

## 2015-01-07 NOTE — Patient Instructions (Addendum)
prilosec 20 x 2(= protonox 40 mg)  x 30 min before bfast and 2 before supper and pepcid 20 mg at bedtime   Wear 02 24/7 for now  For chest pain try nitroglycerin sublingual as needed but be sure you are sitting or lying down when you take it,  Not standing  GERD (REFLUX)  is an extremely common cause of respiratory symptoms just like yours , many times with no obvious heartburn at all.    It can be treated with medication, but also with lifestyle changes including elevation of the head of your bed (ideally with 6 inch  bed blocks),  Smoking cessation, avoidance of late meals, excessive alcohol, and avoid fatty foods, chocolate, peppermint, colas, red wine, and acidic juices such as orange juice.  NO MINT OR MENTHOL PRODUCTS SO NO COUGH DROPS  USE SUGARLESS CANDY INSTEAD (Jolley ranchers or Stover's or Life Savers) or even ice chips will also do - the key is to swallow to prevent all throat clearing. NO OIL BASED VITAMINS - use powdered substitutes.  No evidence of pneumonia > keep appt for your catherization

## 2015-01-07 NOTE — Telephone Encounter (Signed)
lmtcb for pt.  

## 2015-01-07 NOTE — Telephone Encounter (Signed)
Called pt and appt scheduled today at 1:45 with MW. Nothing further needed

## 2015-01-07 NOTE — Progress Notes (Signed)
Subjective:     Patient ID: Stephanie Buck, female   DOB: [birth year], 59 y.o.   MRN: [id number]  HPI  [age] yowf quit smoking 11/2011   @ wt 190 with GOLD III  12/2011 previously followed by [facility] with symbicort / spiriva only using 2lpm hs and with ex referred to pulmonary clinic by Dr Allyson SabalBerry for ? pna suggested on cxr    01/07/2015 1st [facility] office visit/ Sherene SiresWert  / consult requested by Dr Allyson SabalBerry  Chief Complaint  Patient presents with  . Acute Visit    Pt c/o fatigue and increased SOB for the past 6 wks. She had cxr on 01/04/15 that she states showed PNA.   nl sleeps on 2 pillows but in recliner x sev months with freq am sore throat     Sob x months/  Cp x 1.3651m assoc with diaphoresis/ better on 02 / ? Some better on hfa but very very poor mdi technique. Plan for cath 7/14 Cp is midline/ not pleuritic/ mostly with exertion esp if doesn't use 02 / no radiation or nausea   No obvious day to day or daytime variability or assoc  cough or  subjective wheeze or overt sinus or hb symptoms. No unusual exp hx or h/o childhood pna/ asthma or knowledge of premature birth.    Also denies any obvious fluctuation of symptoms with weather or environmental changes or other aggravating or alleviating factors except as outlined above   Current Medications, Allergies, Complete Past Medical History, Past Surgical History, Family History, and Social History were reviewed in [facility] electronic medical record.  ROS  The following are not active complaints unless bolded sore throat, dysphagia, dental problems, itching, sneezing,  nasal congestion or excess/ purulent secretions, ear ache,   fever, chills, sweats, unintended wt loss, classically pleuritic cp, hemoptysis,  orthopnea pnd or leg swelling, presyncope, palpitations, abdominal pain, anorexia, nausea, vomiting, diarrhea  or change in bowel or bladder habits, change in stools or urine, dysuria,hematuria,  rash, arthralgias, visual complaints,  headache, numbness, weakness or ataxia or problems with walking or coordination,  change in mood/affect or memory.        Review of Systems     Objective:   Physical Exam    amb wf nad  Wt Readings from Last 3 Encounters:  01/07/15 208 lb (94.348 kg)  01/04/15 211 lb 12.8 oz (96.072 kg)  12/26/14 205 lb 11.2 oz (93.305 kg)   nl wt     150- 165  Vital signs reviewed  HEENT: nl dentition, turbinates, and orophanx. Nl external ear canals without cough reflex   NECK :  without JVD/Nodes/TM/ nl carotid upstrokes bilaterally   LUNGS: no acc muscle use, clear to A and P bilaterally without cough on insp or exp maneuvers   CV:  RRR  no s3 or murmur or increase in P2, no edema   ABD:  soft and nontender with nl excursion in the supine position. No bruits or organomegaly, bowel sounds nl  MS:  warm without deformities, calf tenderness, cyanosis or clubbing  SKIN: warm and dry without lesions    NEURO:  alert, approp, no deficits     I personally reviewed images and agree with radiology impression as follows:  CXR:  01/04/15 1. Mild right base infiltrate suggesting pneumonia. 2. Cardiomegaly. No pulmonary venous congestion . My review nothing that looks like pna   Labs ordered/ reviewed:    Lab 01/04/15 0909  NA 141  K 4.6  CL 96  CO2 35*  BUN 14  CREATININE 0.71  GLUCOSE 94    Recent Labs Lab 01/04/15 0909  HGB 12.2  HCT 38.3  WBC 6.7  PLT 301     Lab Results  Component Value Date   TSH 2.142 01/04/2015     Lab Results  Component Value Date   PROBNP 665.7* 12/02/2011       Assessment:

## 2015-01-08 ENCOUNTER — Encounter: Payer: Self-pay | Admitting: Internal Medicine

## 2015-01-08 NOTE — Assessment & Plan Note (Signed)
Body mass index is 34.07 kg/(m^2).  Lab Results  Component Value Date   TSH 2.142 01/04/2015    Complicated by hbp/ hypercarbia/ hyperlipidemia  >>>   needs to achieve and maintain neg calorie balance >f/u primary care

## 2015-01-08 NOTE — Assessment & Plan Note (Addendum)
-    HC03  35  01/04/15 - 01/07/2015  Walked 2lpm  x 1 laps @ 185 ft each stopped due to  Slow pace / sob and unsteady on feet/ no desat but cp after stopped   Adequate control on present rx, reviewed > no change in rx needed  But needs rehab if will go once cardiac status worked out

## 2015-01-08 NOTE — Assessment & Plan Note (Addendum)
PFT's 2013:  FEV1 1.29 (50%), ratio 57, TLC normal, +airtrapping, DLCO 56% pred.  +desat with ambulatory oximetry 11/2013 >> wears with heavy exertional activities.  This is not pna clinically or radiographically but clearly Symptoms are markedly disproportionate to objective findings and not clear this is a lung problem but pt does appear to have difficult airway management issues.   DDX of  difficult airways management all start with A and  include Adherence, Ace Inhibitors, Acid Reflux, Active Sinus Disease, Alpha 1 Antitripsin deficiency, Anxiety masquerading as Airways dz,  ABPA,  allergy(esp in young), Aspiration (esp in elderly), Adverse effects of meds,  Active smokers, A bunch of PE's (a small clot burden can't cause this syndrome unless there is already severe underlying pulm or vascular dz with poor reserve) plus two Bs  = Bronchiectasis and Beta blocker use..and one C= CHF   Adherence is always the initial "prime suspect" and is a multilayered concern that requires a "trust but verify" approach in every patient - starting with knowing how to use medications, especially inhalers, correctly, keeping up with refills and understanding the fundamental difference between maintenance and prns vs those medications only taken for a very short course and then stopped and not refilled.  - The proper method of use, as well as anticipated side effects, of a metered-dose inhaler are discussed and demonstrated to the patient. Improved effectiveness after extensive coaching during this visit to a level of approximately   50% from a baseline of near 0   ? Acid (or non-acid) GERD > always difficult to exclude as up to 75% of pts in some series report no assoc GI/ Heartburn symptoms> rec max (24h)  acid suppression and diet restrictions/ reviewed and instructions given in writing.   ? chf /ihd > for cath 7/14 in meantime rec ntg prn cp   I had an extended discussion with the patient reviewing all relevant  studies completed to date    Each maintenance medication was reviewed in detail including most importantly the difference between maintenance and prns and under what circumstances the prns are to be triggered using an action plan format that is not reflected in the computer generated alphabetically organized AVS.    Please see instructions for details which were reviewed in writing and the patient given a copy highlighting the part that I personally wrote and discussed at today's ov.   Total time face to face= 8261m review case with pt/ discussion/ counseling/ giving and going over instructions (see avs)

## 2015-01-10 ENCOUNTER — Encounter (HOSPITAL_COMMUNITY)
Admission: RE | Disposition: A | Discharge status: Home / Self Care | Payer: Self-pay | Source: Ambulatory Visit | Attending: Cardiovascular Disease

## 2015-01-10 ENCOUNTER — Ambulatory Visit (HOSPITAL_COMMUNITY)
Admission: RE | Admit: 2015-01-10 | Discharge: 2015-01-10 | Disposition: A | Discharge status: Home / Self Care | Payer: Medicare Other | Source: Ambulatory Visit | Attending: Cardiovascular Disease | Admitting: Cardiovascular Disease

## 2015-01-10 ENCOUNTER — Encounter (HOSPITAL_COMMUNITY): Payer: Self-pay | Admitting: Cardiovascular Disease

## 2015-01-10 DIAGNOSIS — I209 Angina pectoris, unspecified: Secondary | ICD-10-CM

## 2015-01-10 DIAGNOSIS — E663 Overweight: Secondary | ICD-10-CM | POA: Diagnosis not present

## 2015-01-10 DIAGNOSIS — E785 Hyperlipidemia, unspecified: Secondary | ICD-10-CM | POA: Diagnosis not present

## 2015-01-10 DIAGNOSIS — Z87891 Personal history of nicotine dependence: Secondary | ICD-10-CM | POA: Diagnosis not present

## 2015-01-10 DIAGNOSIS — Z8249 Family history of ischemic heart disease and other diseases of the circulatory system: Secondary | ICD-10-CM | POA: Insufficient documentation

## 2015-01-10 DIAGNOSIS — I10 Essential (primary) hypertension: Secondary | ICD-10-CM | POA: Diagnosis not present

## 2015-01-10 DIAGNOSIS — J449 Chronic obstructive pulmonary disease, unspecified: Secondary | ICD-10-CM | POA: Insufficient documentation

## 2015-01-10 DIAGNOSIS — R079 Chest pain, unspecified: Secondary | ICD-10-CM | POA: Diagnosis present

## 2015-01-10 DIAGNOSIS — Z9981 Dependence on supplemental oxygen: Secondary | ICD-10-CM | POA: Diagnosis not present

## 2015-01-10 DIAGNOSIS — R0602 Shortness of breath: Secondary | ICD-10-CM | POA: Insufficient documentation

## 2015-01-10 HISTORY — PX: CARDIAC CATHETERIZATION: SHX172

## 2015-01-10 SURGERY — LEFT HEART CATH AND CORONARY ANGIOGRAPHY
Anesthesia: LOCAL

## 2015-01-10 MED ORDER — SODIUM CHLORIDE 0.9 % IV SOLN: 250.0000 mL | INTRAVENOUS | Status: DC | PRN

## 2015-01-10 MED ORDER — ONDANSETRON HCL 4 MG/2ML IJ SOLN: 4.0000 mg | Freq: Four times a day (QID) | INTRAMUSCULAR | Status: DC | PRN

## 2015-01-10 MED ORDER — ASPIRIN 81 MG PO CHEW: 81.0000 mg | CHEWABLE_TABLET | Freq: Every day | ORAL | Status: DC

## 2015-01-10 MED ORDER — MIDAZOLAM HCL 2 MG/2ML IJ SOLN
INTRAMUSCULAR | Status: DC | PRN
  Administered 2015-01-10: 0.5 mg via INTRAVENOUS

## 2015-01-10 MED ORDER — SODIUM CHLORIDE 0.9 % WEIGHT BASED INFUSION: 3.0000 mL/kg/h | INTRAVENOUS | Status: AC

## 2015-01-10 MED ORDER — FENTANYL CITRATE (PF) 100 MCG/2ML IJ SOLN
INTRAMUSCULAR | Status: DC | PRN
  Administered 2015-01-10: 25 ug via INTRAVENOUS

## 2015-01-10 MED ORDER — NITROGLYCERIN 1 MG/10 ML FOR IR/CATH LAB
INTRA_ARTERIAL | Status: AC
  Filled 2015-01-10: qty 10

## 2015-01-10 MED ORDER — HEPARIN SODIUM (PORCINE) 1000 UNIT/ML IJ SOLN
INTRAMUSCULAR | Status: AC
  Filled 2015-01-10: qty 1

## 2015-01-10 MED ORDER — SODIUM CHLORIDE 0.9 % IJ SOLN: 3.0000 mL | Freq: Two times a day (BID) | INTRAMUSCULAR | Status: DC

## 2015-01-10 MED ORDER — ASPIRIN 81 MG PO CHEW: 81.0000 mg | CHEWABLE_TABLET | ORAL | Status: DC

## 2015-01-10 MED ORDER — SODIUM CHLORIDE 0.9 % WEIGHT BASED INFUSION
3.0000 mL/kg/h | INTRAVENOUS | Status: AC
  Administered 2015-01-10: 3 mL/kg/h via INTRAVENOUS

## 2015-01-10 MED ORDER — SODIUM CHLORIDE 0.9 % IJ SOLN: 3.0000 mL | INTRAMUSCULAR | Status: DC | PRN

## 2015-01-10 MED ORDER — FENTANYL CITRATE (PF) 100 MCG/2ML IJ SOLN
INTRAMUSCULAR | Status: AC
  Filled 2015-01-10: qty 2

## 2015-01-10 MED ORDER — HEPARIN SODIUM (PORCINE) 1000 UNIT/ML IJ SOLN
INTRAMUSCULAR | Status: DC | PRN
  Administered 2015-01-10: 4500 [IU] via INTRAVENOUS

## 2015-01-10 MED ORDER — MORPHINE SULFATE 2 MG/ML IJ SOLN: 2.0000 mg | INTRAMUSCULAR | Status: DC | PRN

## 2015-01-10 MED ORDER — MIDAZOLAM HCL 2 MG/2ML IJ SOLN
INTRAMUSCULAR | Status: AC
  Filled 2015-01-10: qty 2

## 2015-01-10 MED ORDER — VERAPAMIL HCL 2.5 MG/ML IV SOLN
INTRAVENOUS | Status: AC
  Filled 2015-01-10: qty 2

## 2015-01-10 MED ORDER — RADIAL COCKTAIL (HEPARIN/VERAPAMIL/LIDOCAINE/NITRO)
Status: DC | PRN
  Administered 2015-01-10: 1 via INTRA_ARTERIAL

## 2015-01-10 MED ORDER — SODIUM CHLORIDE 0.9 % WEIGHT BASED INFUSION: 1.0000 mL/kg/h | INTRAVENOUS | Status: DC

## 2015-01-10 MED ORDER — LIDOCAINE HCL (PF) 1 % IJ SOLN
INTRAMUSCULAR | Status: AC
  Filled 2015-01-10: qty 30

## 2015-01-10 MED ORDER — ACETAMINOPHEN 325 MG PO TABS: 650.0000 mg | ORAL_TABLET | ORAL | Status: DC | PRN

## 2015-01-10 MED ORDER — LIDOCAINE HCL (PF) 1 % IJ SOLN
INTRAMUSCULAR | Status: DC | PRN
  Administered 2015-01-10: 08:00:00

## 2015-01-10 MED ORDER — HEPARIN (PORCINE) IN NACL 2-0.9 UNIT/ML-% IJ SOLN
INTRAMUSCULAR | Status: AC
  Filled 2015-01-10: qty 1000

## 2015-01-10 SURGICAL SUPPLY — 14 items
CATH INFINITI 5FR ANG PIGTAIL (CATHETERS) ×2 IMPLANT
CATH INFINITI 5FR MULTPACK ANG (CATHETERS) IMPLANT
CATH OPTITORQUE TIG 4.0 5F (CATHETERS) ×2 IMPLANT
DEVICE RAD COMP TR BAND LRG (VASCULAR PRODUCTS) ×2 IMPLANT
GLIDESHEATH SLEND A-KIT 6F 22G (SHEATH) ×2 IMPLANT
KIT HEART LEFT (KITS) ×2 IMPLANT
PACK CARDIAC CATHETERIZATION (CUSTOM PROCEDURE TRAY) ×2 IMPLANT
SHEATH PINNACLE 5F 10CM (SHEATH) IMPLANT
SYR MEDRAD MARK V 150ML (SYRINGE) ×2 IMPLANT
TRANSDUCER W/STOPCOCK (MISCELLANEOUS) ×2 IMPLANT
TUBING CIL FLEX 10 FLL-RA (TUBING) ×2 IMPLANT
WIRE EMERALD 3MM-J .035X150CM (WIRE) IMPLANT
WIRE HI TORQ VERSACORE-J 145CM (WIRE) ×2 IMPLANT
WIRE SAFE-T 1.5MM-J .035X260CM (WIRE) ×2 IMPLANT

## 2015-01-10 NOTE — Progress Notes (Signed)
TR BAND REMOVAL  LOCATION:    right radial  DEFLATED PER PROTOCOL:    Yes.    TIME BAND OFF / DRESSING APPLIED:    1005   SITE UPON ARRIVAL:    Level 0  SITE AFTER BAND REMOVAL:    Level 0  CIRCULATION SENSATION AND MOVEMENT:    Within Normal Limits   Yes.    COMMENTS:   TRB REMOVED/ TEGADERM DSG APPLIED

## 2015-01-10 NOTE — Discharge Instructions (Signed)
Arteriogram °Care After °These instructions give you information on caring for yourself after your procedure. Your doctor may also give you more specific instructions. Call your doctor if you have any problems or questions after your procedure. °HOME CARE °· Do not bathe, swim, or use a hot tub until directed by your doctor. You can shower. °· Do not lift anything heavier than 10 pounds (about a gallon of milk) for 2 days. °· Do not walk a lot, run, or drive for 2 days. °· Return to normal activities in 2 days or as told by your doctor. °Finding out the results of your test °Ask when your test results will be ready. Make sure you get your test results. °GET HELP RIGHT AWAY IF:  °· You have fever. °· You have more pain in your leg. °· The leg that was cut is: °¨ Bleeding. °¨ Puffy (swollen) or red. °¨ Cold. °¨ Pale or changes color. °¨ Weak. °¨ Tingly or numb. °If you go to the Emergency Room, tell your nurse that you have had an arteriogram. Take this paper with you to show the nurse. °MAKE SURE YOU: °· Understand these instructions. °· Will watch your condition. °· Will get help right away if you are not doing well or get worse. °Document Released: 09/11/2008 Document Revised: 06/20/2013 Document Reviewed: 09/11/2008 °ExitCare® Patient Information ©2015 [employer]. This information is not intended to replace advice given to you by your health care provider. Make sure you discuss any questions you have with your health care provider. ° °

## 2015-01-10 NOTE — H&P (View-Only) (Signed)
01/04/2015 Stephanie ChyleRegina Buck   1956-01-25  [id number]  Primary Physician Quitman LivingsHASSAN,SAMI, MD Primary Cardiologist: Runell GessJonathan J. Jakie Debow MD Roseanne RenoFACP,FACC,FAHA, FSCAI   HPI:  [name] is a pleasant 59 year old mildly overweight married Caucasian female mother of one child, grandmother and 4 grandchildren is accompanied by her husband Stephanie Buck today. She was referred for significant change in her shortness of breath over the last 2-3 months now oxygen dependent continuously with new onset chest pain. Her chronic risk factor profile is notable for 80 pack years of tobacco having quit 4 years ago, treated hypertension, hypokalemia and family history. Her brother has had coronary artery bypass grafting 3 times as well as stents. Her mother had a myocardial infarction. She does have a history of COPD. She has never had a heart attack or stroke. She was on daily at bedtime oxygen for years until 2 months ago when she had sudden change in the severity of her shortness of breath. She saw her pulmonologist who do not feel this was pulmonary in nature. She recently was seen in the ER for chest pain and shortness of breath and ruled out for myocardial infarction. Her EKG showed no acute changes.   Current Outpatient Prescriptions  Medication Sig Dispense Refill  . albuterol (PROVENTIL HFA;VENTOLIN HFA) 108 (90 BASE) MCG/ACT inhaler Inhale 2 puffs into the lungs every 6 (six) hours as needed. For shortness of breath.    . ALPRAZolam (XANAX) 1 MG tablet Take 1 mg by mouth 3 (three) times daily as needed for anxiety.     Marland Kitchen. atenolol (TENORMIN) 50 MG tablet Take 1 tablet by mouth every morning.    . citalopram (CELEXA) 20 MG tablet Take 1 tablet by mouth daily.    . CRESTOR 10 MG tablet Take 1 tablet by mouth daily.    . furosemide (LASIX) 40 MG tablet Take 1 tablet by mouth daily.    . hydrochlorothiazide (HYDRODIURIL) 25 MG tablet Take 1 tablet by mouth daily.    Marland Kitchen. levothyroxine (SYNTHROID, LEVOTHROID) 100 MCG tablet Take  100 mcg by mouth daily.    Marland Kitchen. omeprazole (PRILOSEC) 20 MG capsule Take 1 capsule by mouth daily.    Marland Kitchen. oxyCODONE-acetaminophen (PERCOCET) 5-325 MG per tablet Take 1 tablet by mouth every 4 (four) hours as needed. For pain. 30 tablet 0  . promethazine (PHENERGAN) 25 MG tablet Take 25 mg by mouth every 4 (four) hours as needed for nausea.     . SYMBICORT 160-4.5 MCG/ACT inhaler INHALE 2 PUFFS TWICE DAILY 10.2 g 5  . Tiotropium Bromide Monohydrate (SPIRIVA RESPIMAT) 2.5 MCG/ACT AERS INHALE 2 PUFFS ONCE DAILY 4 g 3   No current facility-administered medications for this visit.    Allergies  Allergen Reactions  . Clindamycin/Lincomycin Other (See Comments)    Causes patient to get C Diff.   . Trazodone And Nefazodone     History   Social History  . Marital Status: Single    Spouse Name: N/A  . Number of Children: N/A  . Years of Education: N/A   Occupational History  . DISABLED    Social History Main Topics  . Smoking status: Former Smoker -- 1.50 packs/day for 41 years    Types: Cigarettes    Quit date: 11/30/2011  . Smokeless tobacco: Not on file  . Alcohol Use: No  . Drug Use: No  . Sexual Activity: Not on file   Other Topics Concern  . Not on file   Social History Narrative  Review of Systems: General: negative for chills, fever, night sweats or weight changes.  Cardiovascular: negative for chest pain, dyspnea on exertion, edema, orthopnea, palpitations, paroxysmal nocturnal dyspnea or shortness of breath Dermatological: negative for rash Respiratory: negative for cough or wheezing Urologic: negative for hematuria Abdominal: negative for nausea, vomiting, diarrhea, bright red blood per rectum, melena, or hematemesis Neurologic: negative for visual changes, syncope, or dizziness All other systems reviewed and are otherwise negative except as noted above.    Blood pressure 110/68, pulse 58, height 5\' 6"  (1.676 m), weight 211 lb 12.8 oz (96.072 kg).  General  appearance: alert and no distress Neck: no adenopathy, no carotid bruit, no JVD, supple, symmetrical, trachea midline and thyroid not enlarged, symmetric, no tenderness/mass/nodules Lungs: clear to auscultation bilaterally Heart: regular rate and rhythm, S1, S2 normal, no murmur, click, rub or gallop Extremities: extremities normal, atraumatic, no cyanosis or edema  EKG not performed today  ASSESSMENT AND PLAN:   Hyperlipidemia History of hyperlipidemia on Crestor 10 mg a day followed by her PCP  Essential hypertension History of hypertension on beta blocker with blood pressure measured at 110/68. Continue current meds at current dosing  Chest pain History of increased shortness of breath over the last 2-3 months with chest pain over the last month. Cardiac risk factor profile is positive for greater than 80 pack years tobacco, treated hypertension, hyperlipidemia and a strong family history for heart disease. I think that her prior probability of CAD is high and therefore I'm going to bypass. Functional study and performed cardiac catheterization next week via the right radial approach. I'm also going to get a 2-D echo with LV function and valvular abnormality. I thoroughly discussed with and benefits the patient wishes to proceed.      Runell GessJonathan J. Zadin Lange MD FACP,FACC,FAHA, [facility] 01/04/2015 8:34 AM

## 2015-01-10 NOTE — Interval H&P Note (Signed)
Cath Lab Visit (complete for each Cath Lab visit)  Clinical Evaluation Leading to the Procedure:   ACS: No.  Non-ACS:    Anginal Classification: CCS III  Anti-ischemic medical therapy: Minimal Therapy (1 class of medications)  Non-Invasive Test Results: No non-invasive testing performed  Prior CABG: No previous CABG      History and Physical Interval Note:  01/10/2015 7:44 AM  Dimas Chyleegina Bolon  has presented today for surgery, with the diagnosis of cp  The various methods of treatment have been discussed with the patient and family. After consideration of risks, benefits and other options for treatment, the patient has consented to  Procedure(s): Left Heart Cath and Coronary Angiography (N/A) as a surgical intervention .  The patient's history has been reviewed, patient examined, no change in status, stable for surgery.  I have reviewed the patient's chart and labs.  Questions were answered to the patient's satisfaction.     Nanetta BattyBerry, Zeb Rawl

## 2015-01-11 MED FILL — Heparin Sodium (Porcine) 2 Unit/ML in Sodium Chloride 0.9%: INTRAMUSCULAR | Qty: 1000 | Status: AC

## 2015-02-01 ENCOUNTER — Ambulatory Visit (INDEPENDENT_AMBULATORY_CARE_PROVIDER_SITE_OTHER): Payer: Medicare Other | Admitting: Cardiovascular Disease

## 2015-02-01 ENCOUNTER — Encounter: Payer: Self-pay | Admitting: Cardiovascular Disease

## 2015-02-01 VITALS — BP 102/60 | HR 68 | Ht 65.5 in | Wt 210.0 lb

## 2015-02-01 DIAGNOSIS — I10 Essential (primary) hypertension: Secondary | ICD-10-CM | POA: Diagnosis not present

## 2015-02-01 DIAGNOSIS — R079 Chest pain, unspecified: Secondary | ICD-10-CM | POA: Diagnosis not present

## 2015-02-01 DIAGNOSIS — E785 Hyperlipidemia, unspecified: Secondary | ICD-10-CM

## 2015-02-01 NOTE — Patient Instructions (Signed)
Follow up as needed with Dr Berry. 

## 2015-02-01 NOTE — Progress Notes (Signed)
02/01/2015 Stephanie Buck   [birth year]  [id number]  Primary Physician Quitman Livings, MD Primary Cardiologist: Runell Gess MD Stephanie Buck   HPI:  Stephanie Buck is a 59 year old moderately overweight married Caucasian female with multiple cardiac risk factors including greater than 80 pack years of tobacco abuse, treated hypertension, hyperlipidemia and a strong family history of heart disease. She has had marked progression of her symptoms of the several months. She also has had the onset of chest pain and oxygen dependent COPD/shortness of breath. Because of her high prior probability of heart disease I elected to proceed directly to outpatient diagnostic coronary arteriography to define her anatomy which I dictated on 01/10/15 revealed normal coronary arteries and normal LV function suggesting that her pain was not cardiac.  Current Outpatient Prescriptions  Medication Sig Dispense Refill  . albuterol (PROVENTIL HFA;VENTOLIN HFA) 108 (90 BASE) MCG/ACT inhaler Inhale 2 puffs into the lungs every 6 (six) hours as needed. For shortness of breath.    . ALPRAZolam (XANAX) 1 MG tablet Take 1 mg by mouth 3 (three) times daily as needed for anxiety.     Marland Kitchen aspirin 81 MG tablet Take 81 mg by mouth daily.    Marland Kitchen atenolol (TENORMIN) 50 MG tablet Take 1 tablet by mouth every morning.    . citalopram (CELEXA) 20 MG tablet Take 1 tablet by mouth daily.    . CRESTOR 10 MG tablet Take 1 tablet by mouth daily.    . famotidine (PEPCID) 20 MG tablet One at bedtime 30 tablet 2  . furosemide (LASIX) 40 MG tablet Take 1 tablet by mouth daily.    . hydrochlorothiazide (HYDRODIURIL) 25 MG tablet Take 1 tablet by mouth daily.    Marland Kitchen levothyroxine (SYNTHROID, LEVOTHROID) 100 MCG tablet Take 100 mcg by mouth daily.    . nitroGLYCERIN (NITROSTAT) 0.3 MG SL tablet Place 1 tablet (0.3 mg total) under the tongue every 5 (five) minutes as needed for chest pain. 30 tablet 1  . oxyCODONE-acetaminophen (PERCOCET)  5-325 MG per tablet Take 1 tablet by mouth every 4 (four) hours as needed. For pain. 30 tablet 0  . pantoprazole (PROTONIX) 40 MG tablet Take 1 tablet (40 mg total) by mouth 2 (two) times daily before a meal. Take 30-60 min before first meal of the day 60 tablet 2  . promethazine (PHENERGAN) 25 MG tablet Take 25 mg by mouth every 4 (four) hours as needed for nausea.     . SYMBICORT 160-4.5 MCG/ACT inhaler INHALE 2 PUFFS TWICE DAILY 10.2 g 5  . Tiotropium Bromide Monohydrate (SPIRIVA RESPIMAT) 2.5 MCG/ACT AERS INHALE 2 PUFFS ONCE DAILY 4 g 3  . meloxicam (MOBIC) 7.5 MG tablet Take 1 tablet by mouth daily.     No current facility-administered medications for this visit.    Allergies  Allergen Reactions  . Clindamycin/Lincomycin Other (See Comments)    Causes patient to get C Diff.   . Trazodone And Nefazodone     History   Social History  . Marital Status: Single    Spouse Name: N/A  . Number of Children: N/A  . Years of Education: N/A   Occupational History  . DISABLED    Social History Main Topics  . Smoking status: Former Smoker -- 1.50 packs/day for 41 years    Types: Cigarettes    Quit date: 11/30/2011  . Smokeless tobacco: Not on file  . Alcohol Use: No  . Drug Use: No  . Sexual Activity: Not on file  Other Topics Concern  . Not on file   Social History Narrative     Review of Systems: General: negative for chills, fever, night sweats or weight changes.  Cardiovascular: negative for chest pain, dyspnea on exertion, edema, orthopnea, palpitations, paroxysmal nocturnal dyspnea or shortness of breath Dermatological: negative for rash Respiratory: negative for cough or wheezing Urologic: negative for hematuria Abdominal: negative for nausea, vomiting, diarrhea, bright red blood per rectum, melena, or hematemesis Neurologic: negative for visual changes, syncope, or dizziness All other systems reviewed and are otherwise negative except as noted above.    Blood  pressure 102/60, pulse 68, height 5' 5.5" (1.664 m), weight 210 lb (95.255 kg).  General appearance: alert and no distress Neck: no adenopathy, no carotid bruit, no JVD, supple, symmetrical, trachea midline and thyroid not enlarged, symmetric, no tenderness/mass/nodules Lungs: clear to auscultation bilaterally Heart: regular rate and rhythm, S1, S2 normal, no murmur, click, rub or gallop Extremities: extremities normal, atraumatic, no cyanosis or edema  EKG not performed today  ASSESSMENT AND PLAN:   Hyperlipidemia History of hyperlipidemia on Crestor 10 mg a day followed by her PCP  Essential hypertension History of hypertension with blood pressure measurements at 102/60. She is on atenolol, and hydrochlorothiazide. Continue current meds at current dosing  Chest pain History of chest pain with recent cardiac catheterization performed 01/10/15 by myself radially that was entirely normal suggesting that her pain was not cardiac.      Runell Gess MD FACP,FACC,FAHA, [facility] 02/01/2015 3:14 PM

## 2015-02-01 NOTE — Assessment & Plan Note (Signed)
History of hyperlipidemia on Crestor 10 mg a day followed by her PCP 

## 2015-02-01 NOTE — Assessment & Plan Note (Signed)
History of hypertension with blood pressure measurements at 102/60. She is on atenolol, and hydrochlorothiazide. Continue current meds at current dosing

## 2015-02-01 NOTE — Assessment & Plan Note (Signed)
History of chest pain with recent cardiac catheterization performed 01/10/15 by myself radially that was entirely normal suggesting that her pain was not cardiac.

## 2015-02-11 ENCOUNTER — Ambulatory Visit (INDEPENDENT_AMBULATORY_CARE_PROVIDER_SITE_OTHER): Payer: Medicare Other | Admitting: Internal Medicine

## 2015-02-11 ENCOUNTER — Encounter: Payer: Self-pay | Admitting: Internal Medicine

## 2015-02-11 VITALS — BP 112/70 | HR 68 | Ht 66.0 in | Wt 213.2 lb

## 2015-02-11 DIAGNOSIS — J449 Chronic obstructive pulmonary disease, unspecified: Secondary | ICD-10-CM | POA: Diagnosis not present

## 2015-02-11 DIAGNOSIS — R0602 Shortness of breath: Secondary | ICD-10-CM

## 2015-02-11 DIAGNOSIS — J9612 Chronic respiratory failure with hypercapnia: Secondary | ICD-10-CM | POA: Diagnosis not present

## 2015-02-11 NOTE — Progress Notes (Signed)
Subjective:     Patient ID: Stephanie Buck, female   DOB: [birth year], 59 y.o.   MRN: [id number]    Brief patient profile:  58 yowf quit smoking 11/2011   @ wt 190 with GOLD III  12/2011 previously followed by [facility] with symbicort / spiriva only using 2lpm hs and with ex referred to pulmonary clinic by Dr Allyson Sabal for ? pna suggested on cxr    History of Present Illness  01/07/2015 1st [facility] office visit/ Stephanie Buck  / consult requested by Dr Allyson Sabal  Chief Complaint  Patient presents with  . Acute Visit    Pt c/o fatigue and increased SOB for the past 6 wks. She had cxr on 01/04/15 that she states showed PNA.   nl sleeps on 2 pillows but in recliner x sev months with freq am sore throat    Sob x months/  Cp x 1.65m assoc with diaphoresis/ better on 02 / ? Some better on hfa but very very poor mdi technique. Plan for cath 7/14 Cp is midline/ not pleuritic/ mostly with exertion esp if doesn't use 02 / no radiation or nausea  rec prilosec 20 x 2(= protonox 40 mg)  x 30 min before bfast and 2 before supper and pepcid 20 mg at bedtime  Wear 02 24/7 for now @2lpm   For chest pain try nitroglycerin sublingual as needed but be sure you are sitting or lying down when you take it,  Not standing GERD diet    02/11/2015 f/u ov/Stephanie Buck re: GOLD II copd/ chronic hypercabia/ ? OHS component  Chief Complaint  Patient presents with  . Follow-up    Pt states improvement in SOB but not back as baseline. Pt states having a heart catherization 12/2014 with normal results.      Back in a bed instead of recliner  On 2lpm can do [employer]    No obvious day to day or daytime variability or assoc  cough or  subjective wheeze or overt sinus or hb symptoms. No unusual exp hx or h/o childhood pna/ asthma or knowledge of premature birth.    Also denies any obvious fluctuation of symptoms with weather or environmental changes or other aggravating or alleviating factors except as outlined above   Current  Medications, Allergies, Complete Past Medical History, Past Surgical History, Family History, and Social History were reviewed in [employer] record.  ROS  The following are not active complaints unless bolded sore throat, dysphagia, dental problems, itching, sneezing,  nasal congestion or excess/ purulent secretions, ear ache,   fever, chills, sweats, unintended wt loss, classically pleuritic cp, hemoptysis,  orthopnea pnd or leg swelling, presyncope, palpitations, abdominal pain, anorexia, nausea, vomiting, diarrhea  or change in bowel or bladder habits, change in stools or urine, dysuria,hematuria,  rash, arthralgias, visual complaints, headache, numbness, weakness or ataxia or problems with walking or coordination,  change in mood/affect or memory.              Objective:   Physical Exam   amb wf nad  02/11/2015       213  Wt Readings from Last 3 Encounters:  01/07/15 208 lb (94.348 kg)  01/04/15 211 lb 12.8 oz (96.072 kg)  12/26/14 205 lb 11.2 oz (93.305 kg)   nl wt     150- 165  Vital signs reviewed  HEENT: nl dentition, turbinates, and orophanx. Nl external ear canals without cough reflex   NECK :  without JVD/Nodes/TM/ nl carotid upstrokes  bilaterally   LUNGS: no acc muscle use, clear to A and P bilaterally without cough on insp or exp maneuvers   CV:  RRR  no s3 or murmur or increase in P2, no edema   ABD:  soft and nontender with nl excursion in the supine position. No bruits or organomegaly, bowel sounds nl  MS:  warm without deformities, calf tenderness, cyanosis or clubbing  SKIN: warm and dry without lesions    NEURO:  alert, approp, no deficits     I personally reviewed images and agree with radiology impression as follows:  CXR:  01/04/15 1. Mild right base infiltrate suggesting pneumonia. 2. Cardiomegaly. No pulmonary venous congestion . My review nothing that looks like pna   Labs ordered/ reviewed:    Lab 01/04/15 0909   NA 141  K 4.6  CL 96  CO2 35*  BUN 14  CREATININE 0.71  GLUCOSE 94    Recent Labs Lab 01/04/15 0909  HGB 12.2  HCT 38.3  WBC 6.7  PLT 301     Lab Results  Component Value Date   TSH 2.142 01/04/2015     Lab Results  Component Value Date   PROBNP 665.7* 12/02/2011       Assessment:

## 2015-02-11 NOTE — Patient Instructions (Addendum)
Try off spiriva  (remember it's like high octane fuel, takes while to see the difference)  Build up to where you are walking for 30 min without stopping on 02   Please schedule a follow up office visit in 6 weeks, call sooner if needed with pfts on return  Add  consider laba/lama if worse off spiriva

## 2015-02-17 ENCOUNTER — Encounter: Payer: Self-pay | Admitting: Internal Medicine

## 2015-02-17 NOTE — Assessment & Plan Note (Signed)
LHC 01/10/15 nl coronary anatomy and LV function so attributable to obesity/copd > see sep a/p's

## 2015-02-17 NOTE — Assessment & Plan Note (Addendum)
PFT's 2013:  FEV1 1.29 (50%), ratio 57, TLC normal, +airtrapping, DLCO 56% pred.  +desat with ambulatory oximetry 11/2013 >> wears with heavy exertional activities. - 01/07/2015 p extensive coaching HFA effectiveness =    50% from a baseline of near 0  The proper method of use, as well as anticipated side effects, of a metered-dose inhaler are discussed and demonstrated to the patient. Improved effectiveness after extensive coaching during this visit to a level of approximately  75%    Improved with better hfa technique but a long way to go in terms of addressing her poor ex tol due to deconditioning/obesity Doubt she really needs spiriva so will try off and have her return for pfts / consider laba/lama if worse off spiriva  I had an extended discussion with the patient reviewing all relevant studies completed to date and  lasting 15 to 20 minutes of a 25 minute visit    Each maintenance medication was reviewed in detail including most importantly the difference between maintenance and prns and under what circumstances the prns are to be triggered using an action plan format that is not reflected in the computer generated alphabetically organized AVS.    Please see instructions for details which were reviewed in writing and the patient given a copy highlighting the part that I personally wrote and discussed at today's ov.

## 2015-02-17 NOTE — Assessment & Plan Note (Signed)
-    HC03  35  01/04/15 - 01/07/2015  Walked 2lpm  x 1 laps @ 185 ft each stopped due to  Slow pace / sob and unsteady on feet/ no desat but cp after stopped - 02/11/2015  Walked 2lpm @  2 laps @ 185 ft each stopped due to  Sob at nl pace/ no desats  Needs to do more walking 02 on 02 ? Formal rehab

## 2015-02-17 NOTE — Assessment & Plan Note (Signed)
Body mass index is 34.43 kg/(m^2).  Lab Results  Component Value Date   TSH 2.142 01/04/2015     Contributing to gerd tendency/ doe/reviewed need  achieve and maintain neg calorie balance > defer f/u primary care including intermittently monitoring thyroid status

## 2015-02-22 ENCOUNTER — Telehealth: Payer: Self-pay | Admitting: Internal Medicine

## 2015-02-22 MED ORDER — PREDNISONE 10 MG PO TABS: ORAL_TABLET | ORAL | Status: DC

## 2015-02-22 MED ORDER — AZITHROMYCIN 250 MG PO TABS: ORAL_TABLET | ORAL | Status: DC

## 2015-02-22 NOTE — Telephone Encounter (Signed)
Called spoke with pt. Aware of recs. RX sent in. Nothing further needed 

## 2015-02-22 NOTE — Telephone Encounter (Signed)
Called and spoke to pt. Pt c/o prod cough with grey mucus, chest congestion, right sided chest discomfort when coughing x 2 days. Pt denies increase in SOB, swelling, hemoptysis. Pt states she is taking aleve prn. Pt was seen on 8.15.16 by Dr. Sherene Sires for COPD.   Dr. Sherene Sires please advise. Thanks.

## 2015-02-22 NOTE — Telephone Encounter (Signed)
Try zpak and Prednisone 10 mg take  4 each am x 2 days,   2 each am x 2 days,  1 each am x 2 days and stop   Keep prev appt unless not back to baseline p finish rx

## 2015-02-25 ENCOUNTER — Encounter: Payer: Self-pay | Admitting: Gastroenterology

## 2015-03-25 ENCOUNTER — Ambulatory Visit (INDEPENDENT_AMBULATORY_CARE_PROVIDER_SITE_OTHER): Payer: Medicare Other | Admitting: Internal Medicine

## 2015-03-25 ENCOUNTER — Encounter: Payer: Self-pay | Admitting: Internal Medicine

## 2015-03-25 VITALS — BP 118/70 | HR 72 | Ht 64.25 in | Wt 215.0 lb

## 2015-03-25 DIAGNOSIS — J449 Chronic obstructive pulmonary disease, unspecified: Secondary | ICD-10-CM

## 2015-03-25 DIAGNOSIS — J9612 Chronic respiratory failure with hypercapnia: Secondary | ICD-10-CM | POA: Diagnosis not present

## 2015-03-25 DIAGNOSIS — Z23 Encounter for immunization: Secondary | ICD-10-CM

## 2015-03-25 LAB — PULMONARY FUNCTION TEST
DL/VA % PRED: 88 %
DL/VA: 4.26 ml/min/mmHg/L
DLCO unc % pred: 58 %
DLCO unc: 14.3 ml/min/mmHg
FEF 25-75 Post: 0.55 L/sec
FEF 25-75 Pre: 0.27 L/sec
FEF2575-%Change-Post: 107 %
FEF2575-%Pred-Post: 22 %
FEF2575-%Pred-Pre: 11 %
FEV1-%CHANGE-POST: 32 %
FEV1-%PRED-POST: 34 %
FEV1-%Pred-Pre: 25 %
FEV1-PRE: 0.67 L
FEV1-Post: 0.89 L
FEV1FVC-%Change-Post: 10 %
FEV1FVC-%Pred-Pre: 63 %
FEV6-%Change-Post: 23 %
FEV6-%PRED-POST: 49 %
FEV6-%PRED-PRE: 40 %
FEV6-POST: 1.61 L
FEV6-Pre: 1.31 L
FEV6FVC-%Change-Post: 2 %
FEV6FVC-%PRED-POST: 101 %
FEV6FVC-%PRED-PRE: 99 %
FVC-%Change-Post: 19 %
FVC-%PRED-PRE: 40 %
FVC-%Pred-Post: 48 %
FVC-POST: 1.63 L
FVC-Pre: 1.36 L
POST FEV6/FVC RATIO: 99 %
PRE FEV6/FVC RATIO: 96 %
Post FEV1/FVC ratio: 55 %
Pre FEV1/FVC ratio: 49 %
RV % PRED: 157 %
RV: 3.11 L
TLC % PRED: 88 %
TLC: 4.5 L

## 2015-03-25 MED ORDER — UMECLIDINIUM BROMIDE 62.5 MCG/INH IN AEPB: 1.0000 | INHALATION_SPRAY | Freq: Every morning | RESPIRATORY_TRACT | Status: DC

## 2015-03-25 MED ORDER — FLUTICASONE FUROATE-VILANTEROL 100-25 MCG/INH IN AEPB: INHALATION_SPRAY | RESPIRATORY_TRACT | Status: DC

## 2015-03-25 NOTE — Progress Notes (Signed)
PFT done today. 

## 2015-03-25 NOTE — Assessment & Plan Note (Addendum)
Quit 2013  Wt  175  PFT's 2013:  FEV1 1.29 (50%), ratio 57, TLC normal, +airtrapping, DLCO 56% pred.  +desat with ambulatory oximetry 11/2013 >> wears with heavy exertional activities. - 01/07/2015 p extensive coaching HFA effectiveness =    50% from a baseline of near 0   - 02/11/2015 rec symb 160 2bid and try off spiriva   - PFT's  03/25/2015  FEV1 0.89 (34 % ) ratio 55  p 32 % improvement from saba with DLCO  58 % corrects to 88 % for alv volume   Her ratio is not much worse but fev1 down even p max rx so needs trial of laba/lama/ics > try breo and incruse  The proper method of use, as well as anticipated side effects, of a metered-dose inhaler are discussed and demonstrated to the patient. Improved effectiveness after extensive coaching during this visit to a level of approximately  75% with hfa and 100% with dpi so will try dpi for now  I had an extended discussion with the patient reviewing all relevant studies completed to date and  lasting 15 to 20 minutes of a 25 minute visit    Each maintenance medication was reviewed in detail including most importantly the difference between maintenance and prns and under what circumstances the prns are to be triggered using an action plan format that is not reflected in the computer generated alphabetically organized AVS.    Please see instructions for details which were reviewed in writing and the patient given a copy highlighting the part that I personally wrote and discussed at today's ov.

## 2015-03-25 NOTE — Patient Instructions (Addendum)
Stop symbicort and try BREO one click and Incruse one click each am > go back to symbicort if worsen  Work on inhaler technique:  relax and gently blow all the way out then take a nice smooth deep breath back in, triggering the inhaler at same time you start breathing in.  Hold for up to 5 seconds if you can. Blow out thru nose. Rinse and gargle with water when done     Please schedule a follow up office visit in 4 weeks, call sooner if needed

## 2015-03-25 NOTE — Progress Notes (Signed)
Subjective:     Patient ID: Stephanie Buck, female   DOB: 06/24/1956, 59 y.o.   MRN: [id number]    Brief patient profile:  59   yowf quit smoking 11/2011   @ wt 190 with GOLD III  12/2011 previously followed by [facility] with symbicort / spiriva only using 2lpm hs and with ex referred to pulmonary clinic 01/07/15 by Dr Allyson Sabal for ? pna suggested on cxr    History of Present Illness  01/07/2015 1st [facility] office visit/ Sherene Sires  / consult requested by Dr Allyson Sabal  Chief Complaint  Patient presents with  . Acute Visit    Pt c/o fatigue and increased SOB for the past 6 wks. She had cxr on 01/04/15 that she states showed PNA.   nl sleeps on 2 pillows but in recliner x sev months with freq am sore throat    Sob x months/  Cp x 1.80m assoc with diaphoresis/ better on 02 / ? Some better on hfa but very very poor mdi technique. Plan for cath 7/14 Cp is midline/ not pleuritic/ mostly with exertion esp if doesn't use 02 / no radiation or nausea  rec prilosec 20 x 2(= protonox 40 mg)  x 30 min before bfast and 2 before supper and pepcid 20 mg at bedtime  Wear 02 24/7 for now   For chest pain try nitroglycerin sublingual as needed but be sure you are sitting or lying down when you take it,  Not standing GERD diet    02/11/2015 f/u ov/Blyss Lugar re: GOLD II copd/ chronic hypercabia/ ? OHS component  Chief Complaint  Patient presents with  . Follow-up    Pt states improvement in SOB but not back as baseline. Pt states having a heart catherization 12/2014 with normal results.   Back in a bed instead of recliner  On 2lpm can do [employer]  rec Try off spiriva  (remember it's like high octane fuel, takes while to see the difference) Build up to where you are walking for 30 min without stopping on 02  Please schedule a follow up office visit in 6 weeks, call sooner if needed with pfts on return  Add  consider laba/lama if worse off spiriva    03/25/2015  f/u ov/Kervin Bones re: GOLD III with reversibility   Chief Complaint  Patient presents with  . Follow-up    PFT done today. Pt reports breathing is getting worse-on her O2 24/7.    Tried off spiriva no change / tried off symb and on spiriva so much worse   Doe = MMRC3 = can't walk 100 yards even at a slow pace at a flat grade s stopping due to sob     No obvious day to day or daytime variability or assoc  cough or  subjective wheeze or overt sinus or hb symptoms. No unusual exp hx or h/o childhood pna/ asthma or knowledge of premature birth.    Also denies any obvious fluctuation of symptoms with weather or environmental changes or other aggravating or alleviating factors except as outlined above   Current Medications, Allergies, Complete Past Medical History, Past Surgical History, Family History, and Social History were reviewed in [employer] record.  ROS  The following are not active complaints unless bolded sore throat, dysphagia, dental problems, itching, sneezing,  nasal congestion or excess/ purulent secretions, ear ache,   fever, chills, sweats, unintended wt loss, classically pleuritic cp, hemoptysis,  orthopnea pnd or leg swelling, presyncope, palpitations, abdominal  pain, anorexia, nausea, vomiting, diarrhea  or change in bowel or bladder habits, change in stools or urine, dysuria,hematuria,  rash, arthralgias, visual complaints, headache, numbness, weakness or ataxia or problems with walking or coordination,  change in mood/affect or memory.              Objective:   Physical Exam   amb obese  wf nad  02/11/2015       213 > 03/25/2015  215  Wt Readings from Last 3 Encounters:  01/07/15 208 lb (94.348 kg)  01/04/15 211 lb 12.8 oz (96.072 kg)  12/26/14 205 lb 11.2 oz (93.305 kg)   nl wt     150- 165  Vital signs reviewed  HEENT: nl dentition, turbinates, and orophanx. Nl external ear canals without cough reflex   NECK :  without JVD/Nodes/TM/ nl carotid upstrokes bilaterally   LUNGS: no acc  muscle use, clear to A and P bilaterally without cough on insp or exp maneuvers   CV:  RRR  no s3 or murmur or increase in P2, no edema   ABD:  soft and nontender with nl excursion in the supine position. No bruits or organomegaly, bowel sounds nl  MS:  warm without deformities, calf tenderness, cyanosis or clubbing  SKIN: warm and dry without lesions    NEURO:  alert, approp, no deficits     I personally reviewed images and agree with radiology impression as follows:  CXR:  01/04/15 1. Mild right base infiltrate suggesting pneumonia. 2. Cardiomegaly. No pulmonary venous congestion . My review nothing that looks like pna   Labs   reviewed:    Lab 01/04/15 0909  NA 141  K 4.6  CL 96  CO2 35*  BUN 14  CREATININE 0.71  GLUCOSE 94       Lab 01/04/15 0909  HGB 12.2  HCT 38.3  WBC 6.7  PLT 301     Lab Results  Component Value Date   TSH 2.142 01/04/2015     Lab Results  Component Value Date   PROBNP 665.7* 12/02/2011       Assessment:

## 2015-03-26 ENCOUNTER — Telehealth: Payer: Self-pay | Admitting: *Deleted

## 2015-03-26 DIAGNOSIS — J449 Chronic obstructive pulmonary disease, unspecified: Secondary | ICD-10-CM

## 2015-03-26 NOTE — Telephone Encounter (Signed)
Spoke with the pt and notified of recs per MW  She verbalized understanding  She is okay with rehab referral  Order sent

## 2015-03-26 NOTE — Assessment & Plan Note (Signed)
Complicated by hbp/ hypercarbia/ hyperlipidemia   Body mass index is 36.62 > trending up   Lab Results  Component Value Date   TSH 2.142 01/04/2015     Contributing to gerd tendency/ doe/reviewed the need and the process to achieve and maintain neg calorie balance > defer f/u primary care including intermittently monitoring thyroid status

## 2015-03-26 NOTE — Assessment & Plan Note (Signed)
-    HC03  35  01/04/15 - 01/07/2015  Walked 2lpm  x 1 laps @ 185 ft each stopped due to  Slow pace / sob and unsteady on feet/ no desat but cp after stopped - 02/11/2015  Walked 2lpm @  2 laps @ 185 ft each stopped due to  Sob at nl pace/ no desats - 03/25/2015   Walked 2lpm x one lap @ 185 stopped due to sob/ desat to 85% > rec 3lpm with walking/ refer to Rehab

## 2015-03-26 NOTE — Telephone Encounter (Signed)
-----   Message from Nyoka Cowden, MD sent at 03/26/2015  8:55 AM EDT ----- 3lpm with walking/ refer to Rehab

## 2015-04-01 ENCOUNTER — Telehealth (HOSPITAL_COMMUNITY): Payer: Self-pay

## 2015-04-01 NOTE — Telephone Encounter (Signed)
Called patient to discuss Pulmonary Rehab referral from Dr. Sherene Sires. Patient states that she is interested in the program but she does not currently have transportation. I checked with [facility] and they do not go out to her address. Patient is going to talk with a friend to see if she can bring her to rehab. I will follow up with patient next week.

## 2015-04-08 ENCOUNTER — Telehealth (HOSPITAL_COMMUNITY): Payer: Self-pay | Admitting: *Deleted

## 2015-04-08 ENCOUNTER — Telehealth: Payer: Self-pay | Admitting: Internal Medicine

## 2015-04-08 NOTE — Telephone Encounter (Signed)
Spoke with pt and advised of Dr Wert's recommendations.   Pt verbalized understanding.  Nothing further needed. 

## 2015-04-08 NOTE — Telephone Encounter (Signed)
Very unlikley due to these changes > needs eval by primary - make sure she takes all meds with her

## 2015-04-08 NOTE — Telephone Encounter (Signed)
Per 9/26 OV: Patient Instructions       Stop symbicort and try BREO one click and Incruse one click each am > go back to symbicort if worsen  Work on inhaler technique:  relax and gently blow all the way out then take a nice smooth deep breath back in, triggering the inhaler at same time you start breathing in.  Hold for up to 5 seconds if you can. Blow out thru nose. Rinse and gargle with water when done      Please schedule a follow up office visit in 4 weeks, call sooner if needed   --  Spoke with pt. C/o rash on left side if neck, face red/slightly swollen. She started the breo on 9/27. She also got flu shot that day. She reports this has been going on x 8 days now. She has not changed anything other than the symbicort. Please advise MW thanks

## 2015-04-12 ENCOUNTER — Other Ambulatory Visit: Payer: Self-pay | Admitting: Internal Medicine

## 2015-04-12 ENCOUNTER — Other Ambulatory Visit: Payer: Self-pay | Admitting: Adult Health

## 2015-04-12 MED ORDER — PANTOPRAZOLE SODIUM 40 MG PO TBEC: 40.0000 mg | DELAYED_RELEASE_TABLET | Freq: Two times a day (BID) | ORAL | Status: DC

## 2015-04-16 ENCOUNTER — Telehealth: Payer: Self-pay | Admitting: Internal Medicine

## 2015-04-16 DIAGNOSIS — J439 Emphysema, unspecified: Secondary | ICD-10-CM

## 2015-04-16 MED ORDER — FLUTICASONE FUROATE-VILANTEROL 100-25 MCG/INH IN AEPB
1.0000 | INHALATION_SPRAY | Freq: Every day | RESPIRATORY_TRACT | Status: DC
Start: 2015-04-16 — End: 2015-04-19

## 2015-04-16 MED ORDER — UMECLIDINIUM BROMIDE 62.5 MCG/INH IN AEPB: 1.0000 | INHALATION_SPRAY | Freq: Every morning | RESPIRATORY_TRACT | Status: DC

## 2015-04-16 NOTE — Telephone Encounter (Signed)
Called and spoke with pt Pt stated needing refills on her incruse and breo  Pt informed that refills will be sent to pharmacy Pharmacy verified with pt  Medication refills sent to pharmacy electronically  Nothing further is needed at this time

## 2015-04-16 NOTE — Telephone Encounter (Signed)
Pt  Returning call.Stephanie Buck ° °

## 2015-04-16 NOTE — Telephone Encounter (Signed)
Left message for pt to call back  °

## 2015-04-17 ENCOUNTER — Telehealth: Payer: Self-pay | Admitting: Internal Medicine

## 2015-04-17 NOTE — Telephone Encounter (Signed)
Initiated PA thru cover my meds. Key: Stephanie Buck Sent for review. Will await response.

## 2015-04-18 NOTE — Telephone Encounter (Signed)
Received denial for the Incruse. Insurance states pt must try and fail Anoro, Serevent and Stiolto.

## 2015-04-18 NOTE — Telephone Encounter (Signed)
Ok to try stopping incruse, spiriva if still has it  and symbicort and just take stiolto 2 puffs each am and see us back before this rx runs out to regroup as it may not be enough and make sure has rescue albuterol rx up to date and keep on hand for emergencies

## 2015-04-18 NOTE — Telephone Encounter (Signed)
Yes, all she takes is Stiolto 2 puffs each am and a rescue albuterol 2 pff q 4hprn

## 2015-04-18 NOTE — Telephone Encounter (Signed)
Spoke with pt. She wants to know if she also needs stop the breo. She was taking incruse, breo, spiriva resp and symbicort per pt.  I have not sent in [facility] yet [facility] Please advise thanks

## 2015-04-19 MED ORDER — TIOTROPIUM BROMIDE-OLODATEROL 2.5-2.5 MCG/ACT IN AERS
2.0000 | INHALATION_SPRAY | Freq: Every day | RESPIRATORY_TRACT | Status: DC
Start: 2015-04-19 — End: 2015-05-22

## 2015-04-19 NOTE — Telephone Encounter (Signed)
Called and spoke to pt. Informed her of the changes per Dr. Sherene SiresWert. Rx sent to preferred pharmacy. Appt made with MW for 11.2.16. Pt verbalized understanding and denied any further questions or concerns at this time.

## 2015-04-22 ENCOUNTER — Ambulatory Visit: Payer: Medicare Other | Admitting: Internal Medicine

## 2015-05-01 ENCOUNTER — Ambulatory Visit (INDEPENDENT_AMBULATORY_CARE_PROVIDER_SITE_OTHER): Payer: Medicare Other | Admitting: Internal Medicine

## 2015-05-01 ENCOUNTER — Encounter: Payer: Self-pay | Admitting: Internal Medicine

## 2015-05-01 VITALS — BP 106/72 | HR 70 | Ht 64.5 in | Wt 218.6 lb

## 2015-05-01 DIAGNOSIS — J449 Chronic obstructive pulmonary disease, unspecified: Secondary | ICD-10-CM | POA: Diagnosis not present

## 2015-05-01 DIAGNOSIS — Z23 Encounter for immunization: Secondary | ICD-10-CM

## 2015-05-01 DIAGNOSIS — J9612 Chronic respiratory failure with hypercapnia: Secondary | ICD-10-CM | POA: Diagnosis not present

## 2015-05-01 NOTE — Progress Notes (Signed)
Subjective:     Patient ID: Stephanie Buck, female   DOB: 02-Mar-1956, 59 y.o.   MRN: [id number]    Brief patient profile:  59   yowf quit smoking 11/2011   @ wt 190 with GOLD III  12/2011 previously followed by [facility] maint with symbicort / spiriva only using 2lpm hs and with ex referred to pulmonary clinic 01/07/15 by Dr Allyson Sabal for ? pna suggested on cxr    History of Present Illness  01/07/2015 1st [facility] office visit/ Stephanie Buck  / consult requested by Dr Allyson Sabal  Chief Complaint  Patient presents with  . Acute Visit    Pt c/o fatigue and increased SOB for the past 6 wks. She had cxr on 01/04/15 that she states showed PNA.   nl sleeps on 2 pillows but in recliner x sev months with freq am sore throat    Sob x months/  Cp x 1.72m assoc with diaphoresis/ better on 02 / ? Some better on hfa but very very poor mdi technique. Plan for cath 7/14 Cp is midline/ not pleuritic/ mostly with exertion esp if doesn't use 02 / no radiation or nausea  rec prilosec 20 x 2(= protonox 40 mg)  x 30 min before bfast and 2 before supper and pepcid 20 mg at bedtime  Wear 02 24/7 for now   For chest pain try nitroglycerin sublingual as needed but be sure you are sitting or lying down when you take it,  Not standing GERD diet    02/11/2015 f/u ov/Stephanie Buck re: GOLD II copd/ chronic hypercabia/ ? OHS component  Chief Complaint  Patient presents with  . Follow-up    Pt states improvement in SOB but not back as baseline. Pt states having a heart catherization 12/2014 with normal results.   Back in a bed instead of recliner  On 2lpm can do [employer]  rec Try off spiriva  (remember it's like high octane fuel, takes while to see the difference) Build up to where you are walking for 30 min without stopping on 02  Please schedule a follow up office visit in 6 weeks, call sooner if needed with pfts on return  Add  consider laba/lama if worse off spiriva    03/25/2015  f/u ov/Stephanie Buck re: GOLD III with reversibility   Chief Complaint  Patient presents with  . Follow-up    PFT done today. Pt reports breathing is getting worse-on her O2 24/7.   Tried off spiriva no change / tried off symb and on spiriva >> much worse  Doe = MMRC3 = can't walk 100 yards even at a slow pace at a flat grade s stopping due to sob   rec Stop symbicort and try BREO one click and Incruse one click each am > go back to symbicort if worsen Work on inhaler technique:       05/01/2015  f/u ov/Stephanie Buck re: GOLD III with reversibility and 02 3lpm at hs and activity  On stiolto 2 q am  Chief Complaint  Patient presents with  . Follow-up    Pt states her breathing has improved since the last visit. She uses albuterol inhaler once per day on average.    Doe = MMRC2 = can't walk a nl pace on a flat grade s sob but on 02 can walk up to a half mile slowly flat grade    No obvious day to day or daytime variability or assoc  cough or  subjective wheeze or overt sinus  or hb symptoms. No unusual exp hx or h/o childhood pna/ asthma or knowledge of premature birth.    Also denies any obvious fluctuation of symptoms with weather or environmental changes or other aggravating or alleviating factors except as outlined above   Current Medications, Allergies, Complete Past Medical History, Past Surgical History, Family History, and Social History were reviewed in [facility] electronic [facility] record.  ROS  The following are not active complaints unless bolded sore throat, dysphagia, dental problems, itching, sneezing,  nasal congestion or excess/ purulent secretions, ear ache,   fever, chills, sweats, unintended wt loss, classically pleuritic cp, hemoptysis,  orthopnea pnd or leg swelling, presyncope, palpitations, abdominal pain, anorexia, nausea, vomiting, diarrhea  or change in bowel or bladder habits, change in stools or urine, dysuria,hematuria,  rash, arthralgias, visual complaints, headache, numbness, weakness or ataxia or problems with  walking or coordination,  change in mood/affect or memory.              Objective:   Physical Exam   amb obese  wf nad  02/11/2015       213 > 03/25/2015  215 > 05/01/2015   219  Wt Readings from Last 3 Encounters:  01/07/15 208 lb (94.348 kg)  01/04/15 211 lb 12.8 oz (96.072 kg)  12/26/14 205 lb 11.2 oz (93.305 kg)   nl wt     150- 165  Vital signs reviewed  HEENT: nl dentition, turbinates, and orophanx. Nl external ear canals without cough reflex   NECK :  without JVD/Nodes/TM/ nl carotid upstrokes bilaterally   LUNGS: no acc muscle use, clear to A and P bilaterally though bs distant bilaterally    CV:  RRR  no s3 or murmur or increase in P2, no edema   ABD:  soft and nontender with nl excursion in the supine position. No bruits or organomegaly, bowel sounds nl  MS:  warm without deformities, calf tenderness, cyanosis or clubbing  SKIN: warm and dry without lesions    NEURO:  alert, approp, no deficits     I personally reviewed images and agree with radiology impression as follows:  CXR:  01/04/15 1. Mild right base infiltrate suggesting pneumonia. 2. Cardiomegaly. No pulmonary venous congestion . My review nothing that looks like pna             Assessment:

## 2015-05-01 NOTE — Patient Instructions (Signed)
Ok to have colonoscopy   Continue 02 3lpm walking/ 2 lpm sleeping > ok to adjust in daytime and the goal is keep 90% or greater   Please schedule a follow up visit in 3 months but call sooner if needed

## 2015-05-02 NOTE — Assessment & Plan Note (Signed)
Complicated by hbp/ hypercarbia/ hyperlipidemia  Body mass index is 36.96  Still trending up  Lab Results  Component Value Date   TSH 2.142 01/04/2015     Contributing to gerd tendency/ doe/reviewed the need and the process to achieve and maintain neg calorie balance > defer f/u primary care including intermittently monitoring thyroid status

## 2015-05-02 NOTE — Assessment & Plan Note (Addendum)
Quit 2013  Wt  175  PFT's 2013:  FEV1 1.29 (50%), ratio 57, TLC normal, +airtrapping, DLCO 56% pred.  +desat with ambulatory oximetry 11/2013 >> wears with heavy exertional activities. - 01/07/2015 p extensive coaching HFA effectiveness =    50% from a baseline of near 0   - 02/11/2015 rec symb 160 2bid and try off spiriva   - PFT's  03/25/2015  FEV1 0.89 (34 % ) ratio 55  p 32 % improvement from saba with DLCO  58 % corrects to 88 % for alv volume   - 03/25/2015 trial of breo/incruse > changed to stiolto due to insurance 03/2015   Despite the marked reversibility on pfts acting more like a GROUP B pt at this point for whom LAMA/LABA appears to be the best choice and insurance requiring stiolto for now which is fine with me   I had an extended discussion with the patient reviewing all relevant studies completed to date and  lasting 15 to 20 minutes of a 25 minute visit    Formulary restrictions will be an ongoing challenge for the forseable future and I would be happy to pick an alternative if the pt will first  provide me a list of them but pt  will need to return here for training for any new device that is required eg dpi vs hfa vs respimat.    In meantime we can always provide samples so the patient never runs out of any needed respiratory medications.   Each maintenance medication was reviewed in detail including most importantly the difference between maintenance and prns and under what circumstances the prns are to be triggered using an action plan format that is not reflected in the computer generated alphabetically organized AVS.    Please see instructions for details which were reviewed in writing and the patient given a copy highlighting the part that I personally wrote and discussed at today's ov.

## 2015-05-02 NOTE — Assessment & Plan Note (Signed)
-    HC03  35  01/04/15 - 01/07/2015  Walked 2lpm  x 1 laps @ 185 ft each stopped due to  Slow pace / sob and unsteady on feet/ no desat but cp after stopped - 02/11/2015  Walked 2lpm @  2 laps @ 185 ft each stopped due to  Sob at nl pace/ no desats - 03/25/2015   Walked 2lpm x one lap @ 185 stopped due to sob/ desat to 85% > rec 3lpm with walking/ refer to Rehab > declined   rec as of 05/01/2015  3lpm walking/ 2lpm sleeping  Emphasized needs to wear 02 with ex to burn fat

## 2015-05-03 ENCOUNTER — Encounter: Payer: Self-pay | Admitting: Gastroenterology

## 2015-05-07 ENCOUNTER — Other Ambulatory Visit: Payer: Self-pay

## 2015-05-07 DIAGNOSIS — Z1231 Encounter for screening mammogram for malignant neoplasm of breast: Secondary | ICD-10-CM

## 2015-05-22 ENCOUNTER — Other Ambulatory Visit: Payer: Self-pay | Admitting: Internal Medicine

## 2015-05-27 ENCOUNTER — Telehealth (HOSPITAL_COMMUNITY): Payer: Self-pay

## 2015-05-27 NOTE — Telephone Encounter (Signed)
Patient states that she does not have transportation to come to Pulmonary Rehab.

## 2015-06-03 ENCOUNTER — Telehealth: Payer: Self-pay | Admitting: *Deleted

## 2015-06-03 NOTE — Telephone Encounter (Signed)
Dr Christella HartiganJacobs, Reviewing this pt chart this am and she is on home 02. She had her last colon with Dr Jarold MottoPatterson 12-18-2009 and had tics and polyps, ! TA and 2- HPP.   She is due for a 5 year recall. Do you want her to be a direct hospital or does she need OV.   She also has a hx of rectal bleeding, constipation, C Diff, COPD, HTN, and chest pain  Please advise and thanks for your time Elizebeth BrookingMarie PV

## 2015-06-04 NOTE — Telephone Encounter (Signed)
She needs ngi/rov with instead of surveillance procedures.  thanks

## 2015-06-05 NOTE — Telephone Encounter (Signed)
Instructed pt per Dr Christella HartiganJacobs she needs an OV due to her medical history and her home 02 use.    Cancelled PV 12-14 and colon 12-07 and scheduled OV 08-13-2015 at 3 pm, arrive at 245 pm. Pt verbalized understanding  Stephanie BrookingMarie PV

## 2015-06-13 ENCOUNTER — Ambulatory Visit: Payer: Medicare Other

## 2015-06-25 ENCOUNTER — Encounter: Payer: Medicare Other | Admitting: Gastroenterology

## 2015-07-05 ENCOUNTER — Other Ambulatory Visit: Payer: Self-pay | Admitting: Internal Medicine

## 2015-08-02 ENCOUNTER — Encounter: Payer: Self-pay | Admitting: Internal Medicine

## 2015-08-02 ENCOUNTER — Ambulatory Visit (INDEPENDENT_AMBULATORY_CARE_PROVIDER_SITE_OTHER): Payer: Medicare Other | Admitting: Internal Medicine

## 2015-08-02 VITALS — BP 114/76 | HR 78 | Ht 64.5 in | Wt 224.8 lb

## 2015-08-02 DIAGNOSIS — J9612 Chronic respiratory failure with hypercapnia: Secondary | ICD-10-CM | POA: Diagnosis not present

## 2015-08-02 DIAGNOSIS — J449 Chronic obstructive pulmonary disease, unspecified: Secondary | ICD-10-CM

## 2015-08-02 NOTE — Progress Notes (Signed)
Subjective:     Patient ID: Stephanie Buck, female   DOB: November 29, 1955, 60 y.o.   MRN: [id number]    Brief patient profile:  59   yowf quit smoking 11/2011   @ wt 190 with GOLD III  12/2011 previously followed by [facility] maint with symbicort / spiriva only using 2lpm hs and with ex referred to pulmonary clinic 01/07/15 by Dr Allyson Sabal for ? pna suggested on cxr    History of Present Illness  01/07/2015 1st [facility] office visit/ Sherene Sires  / consult requested by Dr Allyson Sabal  Chief Complaint  Patient presents with  . Acute Visit    Pt c/o fatigue and increased SOB for the past 6 wks. She had cxr on 01/04/15 that she states showed PNA.   nl sleeps on 2 pillows but in recliner x sev months with freq am sore throat    Sob x months/  Cp x 1.74m assoc with diaphoresis/ better on 02 / ? Some better on hfa but very very poor mdi technique. Plan for cath 7/14 Cp is midline/ not pleuritic/ mostly with exertion esp if doesn't use 02 / no radiation or nausea  rec prilosec 20 x 2(= protonox 40 mg)  x 30 min before bfast and 2 before supper and pepcid 20 mg at bedtime  Wear 02 24/7 for now   For chest pain try nitroglycerin sublingual as needed but be sure you are sitting or lying down when you take it,  Not standing GERD diet    02/11/2015 f/u ov/Cora Brierley re: GOLD II copd/ chronic hypercabia/ ? OHS component  Chief Complaint  Patient presents with  . Follow-up    Pt states improvement in SOB but not back as baseline. Pt states having a heart catherization 12/2014 with normal results.   Back in a bed instead of recliner  On 2lpm can do [employer]  rec Try off spiriva  (remember it's like high octane fuel, takes while to see the difference) Build up to where you are walking for 30 min without stopping on 02  Please schedule a follow up office visit in 6 weeks, call sooner if needed with pfts on return  Add  consider laba/lama if worse off spiriva    03/25/2015  f/u ov/Braedon Sjogren re: GOLD III with reversibility   Chief Complaint  Patient presents with  . Follow-up    PFT done today. Pt reports breathing is getting worse-on her O2 24/7.   Tried off spiriva no change / tried off symb and on spiriva >> much worse  Doe = MMRC3 = can't walk 100 yards even at a slow pace at a flat grade s stopping due to sob   rec Stop symbicort and try BREO one click and Incruse one click each am > go back to symbicort if worsen Work on inhaler technique:       05/01/2015  f/u ov/Quanita Barona re: GOLD III with reversibility and 02 3lpm at hs and activity  On stiolto 2 q am  Chief Complaint  Patient presents with  . Follow-up    Pt states her breathing has improved since the last visit. She uses albuterol inhaler once per day on average.   Doe = MMRC2 = can't walk a nl pace on a flat grade s sob but on 02 can walk up to a half mile slowly flat grade  rec Ok to have colonoscopy  Continue 02 3lpm walking/ 2 lpm sleeping > ok to adjust in daytime and the goal  is keep 90% or greater      08/02/2015  f/u ov/Rowene Suto re: GOLD III with reversibility on stiolto and prn 02 but rarely uses  Chief Complaint  Patient presents with  . Follow-up    Pt states that her breathing is doing well.  She rarely uses rescue inhaler. No new co's today.   sleeping well s 02/ no am cough or congestion - Not limited by breathing from desired activities  But relatively sedantary. Says carries 02 just in case she needs it but never does use it, not checking her own sats as agreed last ov      No obvious day to day or daytime variability or assoc  excess/ purulent sputum or mucus plugs  or  subjective wheeze or overt sinus or hb symptoms. No unusual exp hx or h/o childhood pna/ asthma or knowledge of premature birth.    Also denies any obvious fluctuation of symptoms with weather or environmental changes or other aggravating or alleviating factors except as outlined above   Current Medications, Allergies, Complete Past Medical History, Past Surgical  History, Family History, and Social History were reviewed in [employer] record.  ROS  The following are not active complaints unless bolded sore throat, dysphagia, dental problems, itching, sneezing,  nasal congestion or excess/ purulent secretions, ear ache,   fever, chills, sweats, unintended wt loss, classically pleuritic cp, hemoptysis,  orthopnea pnd or leg swelling, presyncope, palpitations, abdominal pain, anorexia, nausea, vomiting, diarrhea  or change in bowel or bladder habits, change in stools or urine, dysuria,hematuria,  rash, arthralgias, visual complaints, headache, numbness, weakness or ataxia or problems with walking or coordination,  change in mood/affect or memory.              Objective:   Physical Exam   amb obese  wf nad  02/11/2015       213 > 03/25/2015  215 > 05/01/2015   219 > 08/02/2015  225     01/07/15 208 lb (94.348 kg)  01/04/15 211 lb 12.8 oz (96.072 kg)  12/26/14 205 lb 11.2 oz (93.305 kg)   nl wt     150- 165  Vital signs reviewed  HEENT: nl dentition, turbinates, and orophanx. Nl external ear canals without cough reflex   NECK :  without JVD/Nodes/TM/ nl carotid upstrokes bilaterally   LUNGS: no acc muscle use, clear to A and P bilaterally though bs distant bilaterally    CV:  RRR  no s3 or murmur or increase in P2, no edema   ABD:  soft and nontender with nl excursion in the supine position. No bruits or organomegaly, bowel sounds nl  MS:  warm without deformities, calf tenderness, cyanosis or clubbing  SKIN: warm and dry without lesions    NEURO:  alert, approp, no deficits     I personally reviewed images and agree with radiology impression as follows:  CXR:  01/04/15 1. Mild right base infiltrate suggesting pneumonia. 2. Cardiomegaly. No pulmonary venous congestion . My review nothing that looks like pna - she has a chronic stable cyst on CT same areae           Assessment:

## 2015-08-02 NOTE — Patient Instructions (Signed)
Ok to have colonoscopy   Continue 02 3lpm walking/ 2 lpm sleeping > ok to adjust in daytime and the goal is keep 90% or greater   Please schedule a follow up visit in 3 months but call sooner if needed  

## 2015-08-04 ENCOUNTER — Encounter: Payer: Self-pay | Admitting: Internal Medicine

## 2015-08-04 NOTE — Assessment & Plan Note (Signed)
-    HC03  35  01/04/15 - 01/07/2015  Walked 2lpm  x 1 laps @ 185 ft each stopped due to  Slow pace / sob and unsteady on feet/ no desat but cp after stopped - 02/11/2015  Walked 2lpm @  2 laps @ 185 ft each stopped due to  Sob at nl pace/ no desats - 03/25/2015   Walked 2lpm x one lap @ 185 stopped due to sob/ desat to 85% > rec 3lpm with walking/ refer to Rehab > declined  - 08/02/2015   Walked RA x one lap @ 185 stopped due to desat to 85% but no sob   As of 08/02/2015 rec 2lpm at hs and 3lpm with walking / reviewed

## 2015-08-04 NOTE — Assessment & Plan Note (Signed)
Quit smoking  2013  Wt  175  PFT's 2013:  FEV1 1.29 (50%), ratio 57, TLC normal, +airtrapping, DLCO 56% pred.  +desat with ambulatory oximetry 11/2013 >> wears with heavy exertional activities. - 01/07/2015 p extensive coaching HFA effectiveness =    50% from a baseline of near 0   - 02/11/2015 rec symb 160 2bid and try off spiriva   - PFT's  03/25/2015  FEV1 0.89 (34 % ) ratio 55  p 32 % improvement from saba with DLCO  58 % corrects to 88 % for alv volume   - 03/25/2015 trial of breo/incruse > changed to stiolto due to insurance 03/2015    When respiratory symptoms begin or become refractory well after a patient reports complete smoking cessation,  Especially when this wasn't the case while they were smoking, a red flag is raised based on the work of Dr Primitivo Gauze which states:  if you quit smoking when your best day FEV1 is still well preserved it is highly unlikely you will progress to severe disease.  That is to say, once the smoking stops,  the symptoms should not suddenly erupt or markedly worsen.  If so, the differential diagnosis should include  obesity/deconditioning,  LPR/Reflux/Aspiration syndromes,  occult CHF, or  especially side effect of medications commonly used in this population.    Until / unless she loses wt she's unlikely to improve ex tol or 02 dep and in fact explained to here she needs 02 to help burn fat when exercising  Really nothing that sound like aecopd or sign ab > Adequate control on present rx, reviewed > no change in rx needed    I had an extended discussion with the patient reviewing all relevant studies completed to date and  lasting 15 to 20 minutes of a 25 minute visit    Each maintenance medication was reviewed in detail including most importantly the difference between maintenance and prns and under what circumstances the prns are to be triggered using an action plan format that is not reflected in the computer generated alphabetically organized AVS.    Please  see instructions for details which were reviewed in writing and the patient given a copy highlighting the part that I personally wrote and discussed at today's ov.

## 2015-08-04 NOTE — Assessment & Plan Note (Signed)
Complicated by hbp/ hypercarbia/ hyperlipidemia  Body mass index is 38 (so trending up)   Lab Results  Component Value Date   TSH 2.142 01/04/2015     Contributing to gerd tendency/ doe/reviewed the need and the process to achieve and maintain neg calorie balance > defer f/u primary care including intermittently monitoring thyroid status

## 2015-08-13 ENCOUNTER — Ambulatory Visit (INDEPENDENT_AMBULATORY_CARE_PROVIDER_SITE_OTHER): Payer: Medicare Other | Admitting: Gastroenterology

## 2015-08-13 ENCOUNTER — Encounter: Payer: Self-pay | Admitting: Gastroenterology

## 2015-08-13 VITALS — HR 58 | Ht 64.5 in | Wt 218.0 lb

## 2015-08-13 DIAGNOSIS — K5900 Constipation, unspecified: Secondary | ICD-10-CM

## 2015-08-13 MED ORDER — NA SULFATE-K SULFATE-MG SULF 17.5-3.13-1.6 GM/177ML PO SOLN
1.0000 | Freq: Once | ORAL | Status: DC
Start: 2015-08-13 — End: 2015-10-31

## 2015-08-13 NOTE — Patient Instructions (Signed)
Increase your miralax to 2 doses every day. Continue colace nightly. You will be set up for a colonoscopy for polyp surveillance ([facility])

## 2015-08-13 NOTE — Progress Notes (Signed)
Review of pertinent gastrointestinal problems: 1. Precancerous colon polyps. Colonoscopy Dr. Sheryn Bison 11/2009, done for routine screening. "Multiple polyps" were removed from the left colon ranging in size from 3-9 mm. Pathology showed 2 hyperplastic polyps and one adenomatous polyp. He recommended repeat colonoscopy at five-year interval. 2. Left sided colon diverticulosis, noted on colonoscopy 11/2009.   HPI: This is a   very pleasant 60 year old woman  who we recently sent a recall letter to for polyp surveillance. It was determined that she was on home oxygen and so instead of direct colonoscopy she was set up for this visit.  Chief complaint is polyps, constipation  On oxygen since 2013.  She is supposed to be on it 24 hours per day, 3 liters.  Stopped smoking years ago.  Her sister had anal cancer.  Father had prostate cancer.  Lately pretty constipated, can be painful even and then bloody.  More in past few years.  In several months 2 bms per week, with a lot of straining, pushing.  This has been going on for several years.  She takes miralax every day usually every night.  2 colaces every night.     Review of systems: Pertinent positive and negative review of systems were noted in the above HPI section. Complete review of systems was performed and was otherwise normal.   Past Medical History  Diagnosis Date  . COPD (chronic obstructive pulmonary disease) (HCC)   . Hyperlipidemia   . Asthma   . Hypertension   . Cancer (HCC)   . Thyroid disease   . Hypothyroid   . Chest pain     Past Surgical History  Procedure Laterality Date  . Cervical disc surgery    . Abdominal hysterectomy    . Breast surgery    . Cardiac catheterization N/A 01/10/2015    Procedure: Left Heart Cath and Coronary Angiography;  Surgeon: Runell Gess, MD;  Location: [facility] INVASIVE CV LAB;  Service: Cardiovascular;  Laterality: N/A;    Current Outpatient Prescriptions  Medication Sig  Dispense Refill  . albuterol (PROVENTIL HFA;VENTOLIN HFA) 108 (90 BASE) MCG/ACT inhaler Inhale 2 puffs into the lungs every 6 (six) hours as needed. For shortness of breath.    . ALPRAZolam (XANAX) 1 MG tablet Take 1 mg by mouth 3 (three) times daily as needed for anxiety.     Marland Kitchen aspirin 81 MG tablet Take 81 mg by mouth daily.    Marland Kitchen atenolol (TENORMIN) 50 MG tablet Take 1 tablet by mouth every morning.    . citalopram (CELEXA) 20 MG tablet Take 1 tablet by mouth daily.    . CRESTOR 10 MG tablet Take 1 tablet by mouth daily.    . famotidine (PEPCID) 20 MG tablet TAKE 1 TABLET AT BEDTIME 30 tablet 6  . furosemide (LASIX) 40 MG tablet Take 1 tablet by mouth daily.    . hydrochlorothiazide (HYDRODIURIL) 25 MG tablet Take 1 tablet by mouth daily.    Marland Kitchen levothyroxine (SYNTHROID, LEVOTHROID) 100 MCG tablet Take 100 mcg by mouth daily.    . meloxicam (MOBIC) 7.5 MG tablet Take 1 tablet by mouth daily.    . nitroGLYCERIN (NITROSTAT) 0.3 MG SL tablet Place 1 tablet (0.3 mg total) under the tongue every 5 (five) minutes as needed for chest pain. 30 tablet 1  . omeprazole (PRILOSEC) 40 MG capsule Take 40 mg by mouth daily.    Marland Kitchen oxyCODONE-acetaminophen (PERCOCET) 5-325 MG per tablet Take 1 tablet by mouth every 4 (four) hours  as needed. For pain. 30 tablet 0  . STIOLTO RESPIMAT 2.5-2.5 MCG/ACT AERS INHALE 2 PUFFS INTO THE LUNGS ONCE DAILY 4 g 3   No current facility-administered medications for this visit.    Allergies as of 08/13/2015 - Review Complete 08/13/2015  Allergen Reaction Noted  . Clindamycin/lincomycin Other (See Comments) 11/30/2011  . Trazodone and nefazodone  05/05/2014    Family History  Problem Relation Age of Onset  . Coronary artery disease Mother   . Stroke Father   . Prostate cancer Father   . Heart disease Brother   . Emphysema Brother     Social History   Social History  . Marital Status: Single    Spouse Name: N/A  . Number of Children: N/A  . Years of Education: N/A    Occupational History  . DISABLED    Social History Main Topics  . Smoking status: Former Smoker -- 1.50 packs/day for 41 years    Types: Cigarettes    Quit date: 11/30/2011  . Smokeless tobacco: Not on file  . Alcohol Use: No  . Drug Use: No  . Sexual Activity: Not on file   Other Topics Concern  . Not on file   Social History Narrative     Physical Exam: There were no vitals taken for this visit. Constitutional: generally well-appearing Psychiatric: alert and oriented x3 Eyes: extraocular movements intact Mouth: oral pharynx moist, no lesions Neck: supple no lymphadenopathy Cardiovascular: heart regular rate and rhythm Lungs: clear to auscultation bilaterally Abdomen: soft, nontender, nondistended, no obvious ascites, no peritoneal signs, normal bowel sounds Extremities: no lower extremity edema bilaterally Skin: no lesions on visible extremities   Assessment and plan: 60 y.o. female with  personal history of precancerous polyps, chronic constipation, on 24 hours oxygen for  COPD  I recommended we proceed with colonoscopy at her soonest convenience at [facility] given her oxygen requirements for COPD. She overall looks pretty good except for the fact that she needs oxygen, she gets around fairly well. She is bothered by chronic constipation. Narcotic pain medicines possibly contributed to this. I recommended she double her daily MiraLAX dose as a start point. I will see her the time of her colonoscopy and if she does not have significant improvement in her constipation we'll make further medicine adjustments.   Rob Bunting, MD [facility] 08/13/2015, 3:05 PM  Cc: Quitman Livings, MD

## 2015-08-14 ENCOUNTER — Encounter (HOSPITAL_COMMUNITY): Payer: Self-pay | Admitting: *Deleted

## 2015-08-22 ENCOUNTER — Encounter (HOSPITAL_COMMUNITY): Payer: Self-pay

## 2015-08-22 ENCOUNTER — Encounter (HOSPITAL_COMMUNITY)
Admission: RE | Disposition: A | Discharge status: Home / Self Care | Payer: Self-pay | Source: Ambulatory Visit | Attending: Gastroenterology

## 2015-08-22 ENCOUNTER — Ambulatory Visit (HOSPITAL_COMMUNITY): Payer: Medicare Other | Admitting: Registered Nurse

## 2015-08-22 ENCOUNTER — Ambulatory Visit (HOSPITAL_COMMUNITY)
Admission: RE | Admit: 2015-08-22 | Discharge: 2015-08-22 | Disposition: A | Discharge status: Home / Self Care | Payer: Medicare Other | Source: Ambulatory Visit | Attending: Gastroenterology | Admitting: Gastroenterology

## 2015-08-22 DIAGNOSIS — I10 Essential (primary) hypertension: Secondary | ICD-10-CM | POA: Insufficient documentation

## 2015-08-22 DIAGNOSIS — F329 Major depressive disorder, single episode, unspecified: Secondary | ICD-10-CM | POA: Diagnosis not present

## 2015-08-22 DIAGNOSIS — J449 Chronic obstructive pulmonary disease, unspecified: Secondary | ICD-10-CM | POA: Diagnosis not present

## 2015-08-22 DIAGNOSIS — E669 Obesity, unspecified: Secondary | ICD-10-CM | POA: Diagnosis not present

## 2015-08-22 DIAGNOSIS — Z79899 Other long term (current) drug therapy: Secondary | ICD-10-CM | POA: Diagnosis not present

## 2015-08-22 DIAGNOSIS — F419 Anxiety disorder, unspecified: Secondary | ICD-10-CM | POA: Insufficient documentation

## 2015-08-22 DIAGNOSIS — M1990 Unspecified osteoarthritis, unspecified site: Secondary | ICD-10-CM | POA: Diagnosis not present

## 2015-08-22 DIAGNOSIS — K5909 Other constipation: Secondary | ICD-10-CM | POA: Diagnosis not present

## 2015-08-22 DIAGNOSIS — Z79891 Long term (current) use of opiate analgesic: Secondary | ICD-10-CM | POA: Diagnosis not present

## 2015-08-22 DIAGNOSIS — K5730 Diverticulosis of large intestine without perforation or abscess without bleeding: Secondary | ICD-10-CM | POA: Insufficient documentation

## 2015-08-22 DIAGNOSIS — Z9981 Dependence on supplemental oxygen: Secondary | ICD-10-CM | POA: Insufficient documentation

## 2015-08-22 DIAGNOSIS — E785 Hyperlipidemia, unspecified: Secondary | ICD-10-CM | POA: Diagnosis not present

## 2015-08-22 DIAGNOSIS — Z87891 Personal history of nicotine dependence: Secondary | ICD-10-CM | POA: Diagnosis not present

## 2015-08-22 DIAGNOSIS — J45909 Unspecified asthma, uncomplicated: Secondary | ICD-10-CM | POA: Diagnosis not present

## 2015-08-22 DIAGNOSIS — E039 Hypothyroidism, unspecified: Secondary | ICD-10-CM | POA: Diagnosis not present

## 2015-08-22 DIAGNOSIS — Z800 Family history of malignant neoplasm of digestive organs: Secondary | ICD-10-CM | POA: Diagnosis not present

## 2015-08-22 DIAGNOSIS — K633 Ulcer of intestine: Secondary | ICD-10-CM | POA: Diagnosis not present

## 2015-08-22 DIAGNOSIS — Z86010 Personal history of colonic polyps: Secondary | ICD-10-CM | POA: Insufficient documentation

## 2015-08-22 DIAGNOSIS — K5900 Constipation, unspecified: Secondary | ICD-10-CM

## 2015-08-22 DIAGNOSIS — Z7982 Long term (current) use of aspirin: Secondary | ICD-10-CM | POA: Insufficient documentation

## 2015-08-22 DIAGNOSIS — Z6837 Body mass index (BMI) 37.0-37.9, adult: Secondary | ICD-10-CM | POA: Diagnosis not present

## 2015-08-22 HISTORY — DX: Dependence on supplemental oxygen: Z99.81

## 2015-08-22 HISTORY — DX: Liver disease, unspecified: K76.9

## 2015-08-22 HISTORY — PX: COLONOSCOPY WITH PROPOFOL: SHX5780

## 2015-08-22 HISTORY — DX: Unspecified osteoarthritis, unspecified site: M19.90

## 2015-08-22 HISTORY — DX: Major depressive disorder, single episode, unspecified: F32.9

## 2015-08-22 HISTORY — DX: Depression, unspecified: F32.A

## 2015-08-22 HISTORY — DX: Gastro-esophageal reflux disease without esophagitis: K21.9

## 2015-08-22 SURGERY — COLONOSCOPY WITH PROPOFOL
Anesthesia: Monitor Anesthesia Care

## 2015-08-22 MED ORDER — PROPOFOL 500 MG/50ML IV EMUL
INTRAVENOUS | Status: DC | PRN
  Administered 2015-08-22: 120 ug/kg/min via INTRAVENOUS

## 2015-08-22 MED ORDER — PROPOFOL 10 MG/ML IV BOLUS
INTRAVENOUS | Status: AC
  Filled 2015-08-22: qty 40

## 2015-08-22 MED ORDER — SODIUM CHLORIDE 0.9 % IV SOLN: INTRAVENOUS | Status: DC

## 2015-08-22 MED ORDER — LACTATED RINGERS IV SOLN
INTRAVENOUS | Status: DC
  Administered 2015-08-22: 11:00:00 via INTRAVENOUS

## 2015-08-22 MED ORDER — LIDOCAINE HCL (CARDIAC) 20 MG/ML IV SOLN
INTRAVENOUS | Status: AC
  Filled 2015-08-22: qty 5

## 2015-08-22 MED ORDER — LIDOCAINE HCL (CARDIAC) 20 MG/ML IV SOLN
INTRAVENOUS | Status: DC | PRN
  Administered 2015-08-22: 100 mg via INTRAVENOUS

## 2015-08-22 MED ORDER — PROPOFOL 10 MG/ML IV BOLUS
INTRAVENOUS | Status: AC
  Filled 2015-08-22: qty 20

## 2015-08-22 MED ORDER — PROPOFOL 10 MG/ML IV BOLUS
INTRAVENOUS | Status: DC | PRN
  Administered 2015-08-22 (×3): 20 mg via INTRAVENOUS
  Administered 2015-08-22: 10 mg via INTRAVENOUS
  Administered 2015-08-22: 20 mg via INTRAVENOUS

## 2015-08-22 SURGICAL SUPPLY — 22 items

## 2015-08-22 NOTE — H&P (View-Only) (Signed)
Review of pertinent gastrointestinal problems: 1. Precancerous colon polyps. Colonoscopy Dr. Sheryn Bison 11/2009, done for routine screening. "Multiple polyps" were removed from the left colon ranging in size from 3-9 mm. Pathology showed 2 hyperplastic polyps and one adenomatous polyp. He recommended repeat colonoscopy at five-year interval. 2. Left sided colon diverticulosis, noted on colonoscopy 11/2009.   HPI: This is a   very pleasant 60 year old woman  who we recently sent a recall letter to for polyp surveillance. It was determined that she was on home oxygen and so instead of direct colonoscopy she was set up for this visit.  Chief complaint is polyps, constipation  On oxygen since 2013.  She is supposed to be on it 24 hours per day, 3 liters.  Stopped smoking years ago.  Her sister had anal cancer.  Father had prostate cancer.  Lately pretty constipated, can be painful even and then bloody.  More in past few years.  In several months 2 bms per week, with a lot of straining, pushing.  This has been going on for several years.  She takes miralax every day usually every night.  2 colaces every night.     Review of systems: Pertinent positive and negative review of systems were noted in the above HPI section. Complete review of systems was performed and was otherwise normal.   Past Medical History  Diagnosis Date  . COPD (chronic obstructive pulmonary disease) (HCC)   . Hyperlipidemia   . Asthma   . Hypertension   . Cancer (HCC)   . Thyroid disease   . Hypothyroid   . Chest pain     Past Surgical History  Procedure Laterality Date  . Cervical disc surgery    . Abdominal hysterectomy    . Breast surgery    . Cardiac catheterization N/A 01/10/2015    Procedure: Left Heart Cath and Coronary Angiography;  Surgeon: Runell Gess, MD;  Location: [facility] INVASIVE CV LAB;  Service: Cardiovascular;  Laterality: N/A;    Current Outpatient Prescriptions  Medication Sig  Dispense Refill  . albuterol (PROVENTIL HFA;VENTOLIN HFA) 108 (90 BASE) MCG/ACT inhaler Inhale 2 puffs into the lungs every 6 (six) hours as needed. For shortness of breath.    . ALPRAZolam (XANAX) 1 MG tablet Take 1 mg by mouth 3 (three) times daily as needed for anxiety.     Marland Kitchen aspirin 81 MG tablet Take 81 mg by mouth daily.    Marland Kitchen atenolol (TENORMIN) 50 MG tablet Take 1 tablet by mouth every morning.    . citalopram (CELEXA) 20 MG tablet Take 1 tablet by mouth daily.    . CRESTOR 10 MG tablet Take 1 tablet by mouth daily.    . famotidine (PEPCID) 20 MG tablet TAKE 1 TABLET AT BEDTIME 30 tablet 6  . furosemide (LASIX) 40 MG tablet Take 1 tablet by mouth daily.    . hydrochlorothiazide (HYDRODIURIL) 25 MG tablet Take 1 tablet by mouth daily.    Marland Kitchen levothyroxine (SYNTHROID, LEVOTHROID) 100 MCG tablet Take 100 mcg by mouth daily.    . meloxicam (MOBIC) 7.5 MG tablet Take 1 tablet by mouth daily.    . nitroGLYCERIN (NITROSTAT) 0.3 MG SL tablet Place 1 tablet (0.3 mg total) under the tongue every 5 (five) minutes as needed for chest pain. 30 tablet 1  . omeprazole (PRILOSEC) 40 MG capsule Take 40 mg by mouth daily.    Marland Kitchen oxyCODONE-acetaminophen (PERCOCET) 5-325 MG per tablet Take 1 tablet by mouth every 4 (four) hours  as needed. For pain. 30 tablet 0  . STIOLTO RESPIMAT 2.5-2.5 MCG/ACT AERS INHALE 2 PUFFS INTO THE LUNGS ONCE DAILY 4 g 3   No current facility-administered medications for this visit.    Allergies as of 08/13/2015 - Review Complete 08/13/2015  Allergen Reaction Noted  . Clindamycin/lincomycin Other (See Comments) 11/30/2011  . Trazodone and nefazodone  05/05/2014    Family History  Problem Relation Age of Onset  . Coronary artery disease Mother   . Stroke Father   . Prostate cancer Father   . Heart disease Brother   . Emphysema Brother     Social History   Social History  . Marital Status: Single    Spouse Name: N/A  . Number of Children: N/A  . Years of Education: N/A    Occupational History  . DISABLED    Social History Main Topics  . Smoking status: Former Smoker -- 1.50 packs/day for 41 years    Types: Cigarettes    Quit date: 11/30/2011  . Smokeless tobacco: Not on file  . Alcohol Use: No  . Drug Use: No  . Sexual Activity: Not on file   Other Topics Concern  . Not on file   Social History Narrative     Physical Exam: There were no vitals taken for this visit. Constitutional: generally well-appearing Psychiatric: alert and oriented x3 Eyes: extraocular movements intact Mouth: oral pharynx moist, no lesions Neck: supple no lymphadenopathy Cardiovascular: heart regular rate and rhythm Lungs: clear to auscultation bilaterally Abdomen: soft, nontender, nondistended, no obvious ascites, no peritoneal signs, normal bowel sounds Extremities: no lower extremity edema bilaterally Skin: no lesions on visible extremities   Assessment and plan: 60 y.o. female with  personal history of precancerous polyps, chronic constipation, on 24 hours oxygen for  COPD  I recommended we proceed with colonoscopy at her soonest convenience at [facility] given her oxygen requirements for COPD. She overall looks pretty good except for the fact that she needs oxygen, she gets around fairly well. She is bothered by chronic constipation. Narcotic pain medicines possibly contributed to this. I recommended she double her daily MiraLAX dose as a start point. I will see her the time of her colonoscopy and if she does not have significant improvement in her constipation we'll make further medicine adjustments.   Rob Bunting, MD [facility] 08/13/2015, 3:05 PM  Cc: Quitman Livings, MD

## 2015-08-22 NOTE — Anesthesia Postprocedure Evaluation (Signed)
Anesthesia Post Note  Patient: Stephanie Buck  Procedure(s) Performed: Procedure(s) (LRB): COLONOSCOPY WITH PROPOFOL (N/A)  Patient location during evaluation: PACU Anesthesia Type: MAC Level of consciousness: awake and alert Pain management: pain level controlled Vital Signs Assessment: post-procedure vital signs reviewed and stable Respiratory status: spontaneous breathing, nonlabored ventilation, respiratory function stable and patient connected to nasal cannula oxygen Cardiovascular status: blood pressure returned to baseline and stable Postop Assessment: no signs of nausea or vomiting Anesthetic complications: no    Last Vitals:  Filed Vitals:   08/22/15 1213 08/22/15 1220  BP: 112/57 119/62  Pulse: 88 84  Temp: 36.4 C   Resp: 26 22    Last Pain: There were no vitals filed for this visit.               Forrest Jaroszewski JENNETTE

## 2015-08-22 NOTE — Interval H&P Note (Signed)
History and Physical Interval Note:  08/22/2015 11:28 AM  Stephanie Buck  has presented today for surgery, with the diagnosis of polyp surveillance  The various methods of treatment have been discussed with the patient and family. After consideration of risks, benefits and other options for treatment, the patient has consented to  Procedure(s): COLONOSCOPY WITH PROPOFOL (N/A) as a surgical intervention .  The patient's history has been reviewed, patient examined, no change in status, stable for surgery.  I have reviewed the patient's chart and labs.  Questions were answered to the patient's satisfaction.     Rachael Fee

## 2015-08-22 NOTE — Anesthesia Preprocedure Evaluation (Addendum)
Anesthesia Evaluation  Patient identified by MRN, date of birth, ID band Patient awake    Reviewed: Allergy & Precautions, NPO status , Patient's Chart, lab work & pertinent test results  History of Anesthesia Complications Negative for: history of anesthetic complications  Airway Mallampati: II  TM Distance: >3 FB Neck ROM: Full    Dental no notable dental hx. (+) Dental Advisory Given   Pulmonary asthma , COPD,  COPD inhaler and oxygen dependent, former smoker,    Pulmonary exam normal breath sounds clear to auscultation       Cardiovascular hypertension, Pt. on medications Normal cardiovascular exam Rhythm:Regular Rate:Normal  Followed closely by cardiology. Last heart cath 01/10/15 revealed normal coronary arteries and normal LV function   Neuro/Psych PSYCHIATRIC DISORDERS Anxiety Depression negative neurological ROS     GI/Hepatic Neg liver ROS, GERD  Medicated and Controlled,  Endo/Other  Hypothyroidism obesity  Renal/GU negative Renal ROS  negative genitourinary   Musculoskeletal  (+) Arthritis , Osteoarthritis,    Abdominal   Peds negative pediatric ROS (+)  Hematology negative hematology ROS (+)   Anesthesia Other Findings   Reproductive/Obstetrics negative OB ROS                            Anesthesia Physical Anesthesia Plan  ASA: III  Anesthesia Plan: MAC   Post-op Pain Management:    Induction: Intravenous  Airway Management Planned: Simple Face Mask and Natural Airway  Additional Equipment:   Intra-op Plan:   Post-operative Plan:   Informed Consent: I have reviewed the patients History and Physical, chart, labs and discussed the procedure including the risks, benefits and alternatives for the proposed anesthesia with the patient or authorized representative who has indicated his/her understanding and acceptance.   Dental advisory given  Plan Discussed with:  CRNA  Anesthesia Plan Comments:        Anesthesia Quick Evaluation

## 2015-08-22 NOTE — Anesthesia Procedure Notes (Signed)
Procedure Name: MAC Date/Time: 08/22/2015 11:42 AM Performed by: Jarvis Newcomer A Pre-anesthesia Checklist: Patient identified, Timeout performed, Emergency Drugs available, Suction available and Patient being monitored Patient Re-evaluated:Patient Re-evaluated prior to inductionOxygen Delivery Method: Simple face mask Dental Injury: Teeth and Oropharynx as per pre-operative assessment

## 2015-08-22 NOTE — Transfer of Care (Signed)
Immediate Anesthesia Transfer of Care Note  Patient: Stephanie Buck  Procedure(s) Performed: Procedure(s): COLONOSCOPY WITH PROPOFOL (N/A)  Patient Location: PACU and Endoscopy Unit  Anesthesia Type:MAC  Level of Consciousness: awake, alert , oriented and patient cooperative  Airway & Oxygen Therapy: Patient Spontanous Breathing and Patient connected to face mask oxygen  Post-op Assessment: Report given to RN, Post -op Vital signs reviewed and stable and Patient moving all extremities  Post vital signs: Reviewed and stable  Last Vitals:  Filed Vitals:   08/22/15 1049  BP: 108/53  Pulse: 108  Temp: 36.9 C  Resp: 10    Complications: No apparent anesthesia complications

## 2015-08-22 NOTE — Op Note (Signed)
[facility] [address] [place], [address]   COLONOSCOPY PROCEDURE REPORT  PATIENT: Stephanie, Buck  MR#: [id number] BIRTHDATE: August 21, 1955 , 59  yrs. old GENDER: female ENDOSCOPIST: Rachael Fee, MD PROCEDURE DATE:  08/22/2015 PROCEDURE:   Colonoscopy, surveillance First Screening Colonoscopy - Avg.  risk and is 50 yrs.  old or older - No.  Prior Negative Screening - Now for repeat screening. N/A  History of Adenoma - Now for follow-up colonoscopy & has been > or = to 3 yrs.  Yes hx of adenoma.  Has been 3 or more years since last colonoscopy.  Recommend repeat exam, <10 yrs? No ASA CLASS:   Class IV INDICATIONS:Precancerous colon polyps.  Colonoscopy Dr.  Sheryn Bison 11/2009, done for routine screening.  "Multiple polyps" were removed from the left colon ranging in size from 3-9 mm. Pathology showed 2 hyperplastic polyps and one adenomatous polyp. He recommended repeat colonoscopy at five-year interval.. MEDICATIONS: Monitored anesthesia care  DESCRIPTION OF PROCEDURE:   After the risks benefits and alternatives of the procedure were thoroughly explained, informed consent was obtained.  The digital rectal exam revealed no abnormalities of the rectum.   The Pentax Ped Colon [id number] endoscope was introduced through the anus and advanced to the cecum, which was identified by both the appendix and ileocecal valve. No adverse events experienced.   The quality of the prep was excellent.  The instrument was then slowly withdrawn as the colon was fully examined. Estimated blood loss is zero unless otherwise noted in this procedure report.   COLON FINDINGS: There was a 6mm, clean based, shallow ulcer on the distal aspect of the IC valve.  This was sampled with biopsy and sent to pathology.  There were numerous diverticululm in the left colon.  The examination was otherwise normal.  Retroflexed views revealed no abnormalities. The time to cecum = 4 min  Withdrawal time = 10 min   The scope was withdrawn and the procedure completed. COMPLICATIONS: There were no immediate complications.  ENDOSCOPIC IMPRESSION: There was a 6mm, clean based, shallow ulcer on the distal aspect of the IC valve.  This was sampled with biopsy and sent to pathology. There were numerous diverticululm in the left colon.  The examination was otherwise normal  RECOMMENDATIONS: Await biopsy results; the ulcer is consistent with NSAID related ulcer.  You should try to cut back on ibuprofen, mobic, etc as best that you can. Repeat colon cancer screening in 10 years (consider colonoscopy at that time). Continue two doses of miralax daily.  eSigned:  Rachael Fee, MD 08/22/2015 12:12 PM

## 2015-08-22 NOTE — Discharge Instructions (Signed)

## 2015-08-23 ENCOUNTER — Encounter (HOSPITAL_COMMUNITY): Payer: Self-pay | Admitting: Gastroenterology

## 2015-08-27 ENCOUNTER — Ambulatory Visit
Admission: RE | Admit: 2015-08-27 | Discharge: 2015-08-27 | Disposition: A | Discharge status: Home / Self Care | Payer: Medicare Other | Source: Ambulatory Visit

## 2015-08-27 DIAGNOSIS — Z1231 Encounter for screening mammogram for malignant neoplasm of breast: Secondary | ICD-10-CM

## 2015-09-03 ENCOUNTER — Encounter (HOSPITAL_COMMUNITY): Payer: Self-pay | Admitting: *Deleted

## 2015-09-03 ENCOUNTER — Emergency Department (HOSPITAL_COMMUNITY): Payer: Medicare Other

## 2015-09-03 ENCOUNTER — Observation Stay (HOSPITAL_COMMUNITY)
Admission: EM | Admit: 2015-09-03 | Discharge: 2015-09-05 | Disposition: A | Discharge status: Home / Self Care | Payer: Medicare Other | Attending: Family Medicine | Admitting: Family Medicine

## 2015-09-03 DIAGNOSIS — G459 Transient cerebral ischemic attack, unspecified: Secondary | ICD-10-CM | POA: Diagnosis present

## 2015-09-03 DIAGNOSIS — E785 Hyperlipidemia, unspecified: Secondary | ICD-10-CM | POA: Insufficient documentation

## 2015-09-03 DIAGNOSIS — R410 Disorientation, unspecified: Secondary | ICD-10-CM

## 2015-09-03 DIAGNOSIS — Z6837 Body mass index (BMI) 37.0-37.9, adult: Secondary | ICD-10-CM | POA: Diagnosis not present

## 2015-09-03 DIAGNOSIS — M5030 Other cervical disc degeneration, unspecified cervical region: Secondary | ICD-10-CM | POA: Diagnosis not present

## 2015-09-03 DIAGNOSIS — R4182 Altered mental status, unspecified: Secondary | ICD-10-CM | POA: Diagnosis not present

## 2015-09-03 DIAGNOSIS — R51 Headache: Secondary | ICD-10-CM | POA: Insufficient documentation

## 2015-09-03 DIAGNOSIS — Z7982 Long term (current) use of aspirin: Secondary | ICD-10-CM | POA: Diagnosis not present

## 2015-09-03 DIAGNOSIS — F329 Major depressive disorder, single episode, unspecified: Secondary | ICD-10-CM | POA: Insufficient documentation

## 2015-09-03 DIAGNOSIS — Z87891 Personal history of nicotine dependence: Secondary | ICD-10-CM | POA: Diagnosis not present

## 2015-09-03 DIAGNOSIS — G894 Chronic pain syndrome: Secondary | ICD-10-CM | POA: Insufficient documentation

## 2015-09-03 DIAGNOSIS — R4781 Slurred speech: Principal | ICD-10-CM | POA: Insufficient documentation

## 2015-09-03 DIAGNOSIS — I10 Essential (primary) hypertension: Secondary | ICD-10-CM | POA: Diagnosis not present

## 2015-09-03 DIAGNOSIS — K769 Liver disease, unspecified: Secondary | ICD-10-CM | POA: Diagnosis not present

## 2015-09-03 DIAGNOSIS — R739 Hyperglycemia, unspecified: Secondary | ICD-10-CM | POA: Insufficient documentation

## 2015-09-03 DIAGNOSIS — Z90710 Acquired absence of both cervix and uterus: Secondary | ICD-10-CM | POA: Diagnosis not present

## 2015-09-03 DIAGNOSIS — E039 Hypothyroidism, unspecified: Secondary | ICD-10-CM | POA: Insufficient documentation

## 2015-09-03 DIAGNOSIS — J449 Chronic obstructive pulmonary disease, unspecified: Secondary | ICD-10-CM | POA: Insufficient documentation

## 2015-09-03 DIAGNOSIS — J45909 Unspecified asthma, uncomplicated: Secondary | ICD-10-CM | POA: Diagnosis not present

## 2015-09-03 DIAGNOSIS — F419 Anxiety disorder, unspecified: Secondary | ICD-10-CM | POA: Diagnosis not present

## 2015-09-03 DIAGNOSIS — R197 Diarrhea, unspecified: Secondary | ICD-10-CM | POA: Insufficient documentation

## 2015-09-03 DIAGNOSIS — R413 Other amnesia: Secondary | ICD-10-CM | POA: Diagnosis not present

## 2015-09-03 DIAGNOSIS — Z9981 Dependence on supplemental oxygen: Secondary | ICD-10-CM | POA: Diagnosis not present

## 2015-09-03 DIAGNOSIS — E1165 Type 2 diabetes mellitus with hyperglycemia: Secondary | ICD-10-CM | POA: Insufficient documentation

## 2015-09-03 DIAGNOSIS — K219 Gastro-esophageal reflux disease without esophagitis: Secondary | ICD-10-CM | POA: Insufficient documentation

## 2015-09-03 LAB — COMPREHENSIVE METABOLIC PANEL
ALBUMIN: 3.3 g/dL — AB (ref 3.5–5.0)
ALT: 55 U/L — ABNORMAL HIGH (ref 14–54)
AST: 53 U/L — AB (ref 15–41)
Alkaline Phosphatase: 149 U/L — ABNORMAL HIGH (ref 38–126)
Anion gap: 8 (ref 5–15)
BUN: 11 mg/dL (ref 6–20)
CHLORIDE: 93 mmol/L — AB (ref 101–111)
CO2: 37 mmol/L — AB (ref 22–32)
Calcium: 9.4 mg/dL (ref 8.9–10.3)
Creatinine, Ser: 0.75 mg/dL (ref 0.44–1.00)
GFR calc Af Amer: >60 mL/min (ref >60)
GFR calc non Af Amer: >60 mL/min (ref >60)
GLUCOSE: 211 mg/dL — AB (ref 65–99)
POTASSIUM: 4.3 mmol/L (ref 3.5–5.1)
SODIUM: 138 mmol/L (ref 135–145)
Total Bilirubin: 0.3 mg/dL (ref 0.3–1.2)
Total Protein: 6.8 g/dL (ref 6.5–8.1)

## 2015-09-03 LAB — CBC
HCT: 34.3 % — ABNORMAL LOW (ref 36.0–46.0)
Hemoglobin: 10.4 g/dL — ABNORMAL LOW (ref 12.0–15.0)
MCH: 28.3 pg (ref 26.0–34.0)
MCHC: 30.3 g/dL (ref 30.0–36.0)
MCV: 93.5 fL (ref 78.0–100.0)
Platelets: 227 10*3/uL (ref 150–400)
RBC: 3.67 MIL/uL — ABNORMAL LOW (ref 3.87–5.11)
RDW: 14.5 % (ref 11.5–15.5)
WBC: 7.4 10*3/uL (ref 4.0–10.5)

## 2015-09-03 LAB — PROTIME-INR
INR: 0.99 (ref 0.00–1.49)
Prothrombin Time: 13.3 seconds (ref 11.6–15.2)

## 2015-09-03 LAB — I-STAT CHEM 8, ED
BUN: 12 mg/dL (ref 6–20)
CREATININE: 0.7 mg/dL (ref 0.44–1.00)
Calcium, Ion: 1.15 mmol/L (ref 1.12–1.23)
Chloride: 90 mmol/L — ABNORMAL LOW (ref 101–111)
Glucose, Bld: 210 mg/dL — ABNORMAL HIGH (ref 65–99)
HEMATOCRIT: 37 % (ref 36.0–46.0)
HEMOGLOBIN: 12.6 g/dL (ref 12.0–15.0)
POTASSIUM: 4.3 mmol/L (ref 3.5–5.1)
Sodium: 136 mmol/L (ref 135–145)
TCO2: 37 mmol/L (ref 0–100)

## 2015-09-03 LAB — ETHANOL

## 2015-09-03 LAB — DIFFERENTIAL
BASOS ABS: 0 10*3/uL (ref 0.0–0.1)
BASOS PCT: 0 %
EOS ABS: 0.2 10*3/uL (ref 0.0–0.7)
Eosinophils Relative: 3 %
Lymphocytes Relative: 16 %
Lymphs Abs: 1.2 10*3/uL (ref 0.7–4.0)
Monocytes Absolute: 0.5 10*3/uL (ref 0.1–1.0)
Monocytes Relative: 7 %
NEUTROS PCT: 74 %
Neutro Abs: 5.5 10*3/uL (ref 1.7–7.7)

## 2015-09-03 LAB — APTT: APTT: 27 s (ref 24–37)

## 2015-09-03 LAB — I-STAT TROPONIN, ED: TROPONIN I, POC: 0 ng/mL (ref 0.00–0.08)

## 2015-09-03 MED ORDER — ACETAMINOPHEN 325 MG PO TABS
650.0000 mg | ORAL_TABLET | Freq: Four times a day (QID) | ORAL | Status: DC | PRN
  Administered 2015-09-04 – 2015-09-05 (×3): 650 mg via ORAL
  Filled 2015-09-03 (×3): qty 2

## 2015-09-03 MED ORDER — SODIUM CHLORIDE 0.9% FLUSH
3.0000 mL | Freq: Two times a day (BID) | INTRAVENOUS | Status: DC
  Administered 2015-09-04 (×2): 3 mL via INTRAVENOUS

## 2015-09-03 MED ORDER — ENOXAPARIN SODIUM 40 MG/0.4ML ~~LOC~~ SOLN
40.0000 mg | Freq: Every day | SUBCUTANEOUS | Status: DC
  Administered 2015-09-04: 40 mg via SUBCUTANEOUS
  Filled 2015-09-03 (×2): qty 0.4

## 2015-09-03 MED ORDER — PANTOPRAZOLE SODIUM 40 MG PO TBEC
80.0000 mg | DELAYED_RELEASE_TABLET | Freq: Every day | ORAL | Status: DC
  Administered 2015-09-04: 80 mg via ORAL
  Filled 2015-09-03 (×2): qty 2

## 2015-09-03 MED ORDER — FAMOTIDINE 20 MG PO TABS
20.0000 mg | ORAL_TABLET | Freq: Every day | ORAL | Status: DC
  Administered 2015-09-04: 20 mg via ORAL
  Filled 2015-09-03 (×2): qty 1

## 2015-09-03 MED ORDER — ATENOLOL 25 MG PO TABS
50.0000 mg | ORAL_TABLET | Freq: Every day | ORAL | Status: DC
  Administered 2015-09-04 – 2015-09-05 (×2): 50 mg via ORAL
  Filled 2015-09-03 (×3): qty 2

## 2015-09-03 MED ORDER — ALBUTEROL SULFATE (2.5 MG/3ML) 0.083% IN NEBU: 2.5000 mg | INHALATION_SOLUTION | RESPIRATORY_TRACT | Status: DC | PRN

## 2015-09-03 MED ORDER — CITALOPRAM HYDROBROMIDE 10 MG PO TABS
20.0000 mg | ORAL_TABLET | Freq: Every day | ORAL | Status: DC
  Administered 2015-09-04 – 2015-09-05 (×2): 20 mg via ORAL
  Filled 2015-09-03 (×2): qty 2

## 2015-09-03 MED ORDER — ACETAMINOPHEN 500 MG PO TABS
1000.0000 mg | ORAL_TABLET | Freq: Once | ORAL | Status: AC
  Administered 2015-09-03: 1000 mg via ORAL
  Filled 2015-09-03: qty 2

## 2015-09-03 MED ORDER — LEVOTHYROXINE SODIUM 100 MCG PO TABS
100.0000 ug | ORAL_TABLET | Freq: Every day | ORAL | Status: DC
  Administered 2015-09-04 – 2015-09-05 (×2): 100 ug via ORAL
  Filled 2015-09-03 (×3): qty 1

## 2015-09-03 MED ORDER — ROSUVASTATIN CALCIUM 10 MG PO TABS
10.0000 mg | ORAL_TABLET | Freq: Every day | ORAL | Status: DC
  Administered 2015-09-04: 10 mg via ORAL
  Filled 2015-09-03 (×5): qty 1

## 2015-09-03 MED ORDER — ALBUTEROL SULFATE HFA 108 (90 BASE) MCG/ACT IN AERS: 2.0000 | INHALATION_SPRAY | Freq: Four times a day (QID) | RESPIRATORY_TRACT | Status: DC | PRN

## 2015-09-03 MED ORDER — ACETAMINOPHEN 650 MG RE SUPP: 650.0000 mg | Freq: Four times a day (QID) | RECTAL | Status: DC | PRN

## 2015-09-03 MED ORDER — SODIUM CHLORIDE 0.9 % IV SOLN: 250.0000 mL | INTRAVENOUS | Status: DC | PRN

## 2015-09-03 MED ORDER — SODIUM CHLORIDE 0.9% FLUSH: 3.0000 mL | INTRAVENOUS | Status: DC | PRN

## 2015-09-03 MED ORDER — TIOTROPIUM BROMIDE MONOHYDRATE 18 MCG IN CAPS
18.0000 ug | ORAL_CAPSULE | Freq: Every day | RESPIRATORY_TRACT | Status: DC
  Administered 2015-09-04: 18 ug via RESPIRATORY_TRACT
  Filled 2015-09-03 (×2): qty 5

## 2015-09-03 MED ORDER — FUROSEMIDE 40 MG PO TABS
40.0000 mg | ORAL_TABLET | Freq: Every day | ORAL | Status: DC
  Administered 2015-09-04 – 2015-09-05 (×2): 40 mg via ORAL
  Filled 2015-09-03 (×2): qty 1

## 2015-09-03 MED ORDER — ASPIRIN EC 81 MG PO TBEC
81.0000 mg | DELAYED_RELEASE_TABLET | Freq: Every day | ORAL | Status: DC
  Administered 2015-09-04 – 2015-09-05 (×2): 81 mg via ORAL
  Filled 2015-09-03 (×2): qty 1

## 2015-09-03 MED ORDER — ASPIRIN 81 MG PO CHEW
243.0000 mg | CHEWABLE_TABLET | Freq: Once | ORAL | Status: AC
  Administered 2015-09-03: 243 mg via ORAL
  Filled 2015-09-03: qty 3

## 2015-09-03 NOTE — ED Notes (Signed)
Per patient and family, symptoms have been going on for 2 x weeks.

## 2015-09-03 NOTE — ED Notes (Signed)
Pt reports that she has been having trouble with her words and also feeling weak on the right side. Pt A&Ox4 at triage. States that her words sounds normal at this time. Grip strength slightly weaker on the right side.

## 2015-09-03 NOTE — ED Notes (Signed)
Family at bedside. 

## 2015-09-03 NOTE — H&P (Signed)
[facility] Admission History and Physical Service Pager: [phone]  Patient name: Stephanie Buck Medical record number: [id number] Date of birth: 04/08/1956 Age: 60 y.o. Gender: female  Primary Care Provider: Antonietta Jewel, MD Consultants: Neurology  Code Status: Full  Chief Complaint: Slurred speech 2 weeks  Assessment and Plan: Stephanie Buck is a 60 y.o. female presenting with intermittent slurred speech and confusion 2 weeks. PMH is significant for COPD, hypertension, hyperlipidemia, chronic pain syndrome, morbid obesity, 50+ pack/year smoking history.  # Slurred speech, concerning for TIA: Patient endorses frequent episodes of confusion and slurred speech. Will frequently "mixup" her words. These episodes have been occurring more frequently since its initial onset. Patient's boyfriend states that it has now been occurring more often than not. No correlation with specific activities or times a day. These episodes can last multiple hours at a time. Etiology currently unknown, however differential includes TIA versus seizure disorder versus medication-induced versus substance induced versus psychogenic. Another possible cause (which would need further exploration into the HPI than what was obtained on admission) would be cognitive deficits/disturbances secondary to fatigue/insomnia from undiagnosed OSA. No focal deficits on exam. CT head without acute pathology. No new medications reported by patient. Patient denies substance use. - Patient brought in under observation under the care of family medicine teaching service; attending physician Dr. Gwendlyn Deutscher - Telemetry - Consult neurology - Carotid Dopplers and echocardiogram (later canceled by neurology) - Ammonia level - EEG - MRI brain without contrast - UDS and blood alcohol level - Daily aspirin - Swallow study passed in ED  # COPD: Requires supplemental O2 at home. Physical exam yielded some occasional  wheeze in bilateral lung fields. No crackles present. At this time no significant concern for exacerbation. - Supplemental O2 as needed - Continue home albuterol - Initiating Spiriva - Consider initiating controller medication as an outpatient  # Hypertension - Continue home atenolol, Lasix - EKG benign  # Hyperlipidemia - Continue home Crestor  # Hypothyroidism - Continue home Synthroid - Obtain TSH  # GERD - Protonix and Pepcid  # ??Depression/Anxiety - Continue home Celexa - Holding Xanax and amitriptyline   FEN/GI: IV fluids KVO; heart healthy carb modified diet Prophylaxis: Lovenox  Disposition: Home pending medical improvement  History of Present Illness:  Stephanie Buck is a 60 y.o. female presenting with worsening slurred speech and confusion over the past 2 weeks. Patient states that she has had intermittent slurred speech, confusion, and "mixed up" words over the past 2 weeks. This has gotten progressively worse since its onset. Her boyfriend, who is present in the ED, states that what use to occur occasionally is now happening more often than not. Episodes can last multiple hours, and patient usually does not have much recollection of these episodes. Patient is also endorsing some involuntary upper extremity muscle contractions/twitches. These occur more frequently when she is at rest and not pain attention. These muscle jerks are unrelated to when she experiences her slurred speech but their onset and gradual increase in frequency has been in conjunction with the worsening slurred speech.  Patient has no prior history of seizure disorders. She denies any recent head trauma. Patient does endorse severe headaches which began around the same time as the symptoms described above. She denies vision changes dizziness focal weakness/numbness, chest pain, shortness of breath, dysphasia, nausea, vomiting, diarrhea, urinary/fecal incontinence, fever, chills, or syncope. Nothing  seems to make the symptoms better and she has not noticed any activities or actions  that can bring out an episode. Patient endorses good compliance with her medications.  Review Of Systems: Per HPI Otherwise the remainder of the systems were negative.  Patient Active Problem List   Diagnosis Date Noted  . TIA (transient ischemic attack) 09/03/2015  . Slurred speech 09/03/2015  . Shortness of breath   . Morbid obesity ([place]) 01/08/2015  . Chronic respiratory failure with hypercapnia ([place]) 01/07/2015  . Essential hypertension 01/04/2015  . Chest pain 01/04/2015  . Bleb, lung ([place]) 12/03/2011  . C. difficile diarrhea 12/03/2011  . Nausea vomiting and diarrhea 11/30/2011  . COPD GOLD III  11/30/2011  . Abdominal pain 11/30/2011  . Hyperlipidemia 11/30/2011  . CHRONIC PAIN SYNDROME 12/16/2009  . OTHER CONSTIPATION 12/16/2009  . RECTAL BLEEDING 12/16/2009    Past Medical History: Past Medical History  Diagnosis Date  . COPD (chronic obstructive pulmonary disease) ([place])   . Hyperlipidemia   . Asthma   . Hypertension   . Thyroid disease   . Hypothyroid   . Chest pain   . Depression   . GERD (gastroesophageal reflux disease)   . Arthritis     cervical spine DDD  . Liver lesion     "was told 2013" - not an issue   . History of oxygen administration     Oxygen 3 l/m nasally -bedtime and activity    Past Surgical History: Past Surgical History  Procedure Laterality Date  . Cervical disc surgery      fusion-retained hardware  . Abdominal hysterectomy    . Breast surgery    . Cardiac catheterization N/A 01/10/2015    Procedure: Left Heart Cath and Coronary Angiography;  Surgeon: Lorretta Harp, MD;  Location: [place] [facility] CV LAB;  Service: Cardiovascular;  Laterality: N/A;  . Tubal ligation    . Exploratory laparotomy      "bikini cut" ovarian cyst, "wound dehiscence, scarring."  . Colonoscopy w/ polypectomy      previous hx-"just fount out about past hx polyps.  .  Colonoscopy with propofol N/A 08/22/2015    Procedure: COLONOSCOPY WITH PROPOFOL;  Surgeon: Milus Banister, MD;  Location: [facility] ENDOSCOPY;  Service: Endoscopy;  Laterality: N/A;    Social History: Social History  Substance Use Topics  . Smoking status: Former Smoker -- 1.50 packs/day for 41 years    Types: Cigarettes    Quit date: 11/30/2011  . Smokeless tobacco: None  . Alcohol Use: No   Additional social history: Previous smoker with 50+ pack-year history  Please also refer to relevant sections of EMR.  Family History: Family History  Problem Relation Age of Onset  . Coronary artery disease Mother   . Stroke Father   . Prostate cancer Father   . Heart disease Brother   . Emphysema Brother    Allergies and Medications: Allergies  Allergen Reactions  . Clindamycin/Lincomycin Other (See Comments)    Causes patient to get C Diff.   . Trazodone And Nefazodone Swelling   No current facility-administered medications on file prior to encounter.   Current Outpatient Prescriptions on File Prior to Encounter  Medication Sig Dispense Refill  . albuterol (PROVENTIL HFA;VENTOLIN HFA) 108 (90 BASE) MCG/ACT inhaler Inhale 2 puffs into the lungs every 6 (six) hours as needed. For shortness of breath.    . ALPRAZolam (XANAX) 1 MG tablet Take 1 mg by mouth 3 (three) times daily as needed for anxiety.     Marland Kitchen amitriptyline (ELAVIL) 75 MG tablet Take 75 mg by mouth at  bedtime as needed for sleep.    Marland Kitchen atenolol (TENORMIN) 50 MG tablet Take 1 tablet by mouth every morning.    . citalopram (CELEXA) 20 MG tablet Take 1 tablet by mouth daily.    . CRESTOR 10 MG tablet Take 1 tablet by mouth daily.    . famotidine (PEPCID) 20 MG tablet TAKE 1 TABLET AT BEDTIME 30 tablet 6  . furosemide (LASIX) 40 MG tablet Take 1 tablet by mouth daily.    Marland Kitchen levothyroxine (SYNTHROID, LEVOTHROID) 100 MCG tablet Take 100 mcg by mouth daily.    . nitroGLYCERIN (NITROSTAT) 0.3 MG SL tablet Place 1 tablet (0.3 mg total)  under the tongue every 5 (five) minutes as needed for chest pain. 30 tablet 1  . omeprazole (PRILOSEC) 40 MG capsule Take 40 mg by mouth daily.    Marland Kitchen oxyCODONE-acetaminophen (PERCOCET) 5-325 MG per tablet Take 1 tablet by mouth every 4 (four) hours as needed. For pain. 30 tablet 0  . STIOLTO RESPIMAT 2.5-2.5 MCG/ACT AERS INHALE 2 PUFFS INTO THE LUNGS ONCE DAILY 4 g 3  . Na Sulfate-K Sulfate-Mg Sulf SOLN Take 1 kit by mouth once. (Patient not taking: Reported on 09/03/2015) 354 mL 0    Objective: BP 118/71 mmHg  Pulse 92  Temp(Src) 97.9 F (36.6 C) (Oral)  Resp 18  Ht 5' 4.5" (1.638 m)  Wt 222 lb 9.6 oz (100.971 kg)  BMI 37.63 kg/m2  SpO2 98% Exam: General -- oriented x3, pleasant and cooperative. NAD HEENT -- Head is normocephalic. PERRLA. EOMI. Ears, nose and throat were benign. Neck -- supple; no bruits. No JVD Chest -- good expansion. Occasional wheezes present diffusely. Prolonged expiratory phase Cardiac -- RRR. No murmurs noted.  Abdomen -- obese, soft, nontender. Bowel sounds present. CNS -- cranial nerves II through XII grossly intact. 1+ reflexes bilaterally. Normal Babinski Extremeties - no tenderness or effusions noted. ROM good. 5/5 bilateral strength throughout. Sensation intact throughout. Dorsalis pedis pulses present and symmetrical.    Labs and Imaging: CBC BMET   Recent Labs Lab 09/03/15 1358 09/03/15 1409  WBC 7.4  --   HGB 10.4* 12.6  HCT 34.3* 37.0  PLT 227  --     Recent Labs Lab 09/03/15 1358 09/03/15 1409  NA 138 136  K 4.3 4.3  CL 93* 90*  CO2 37*  --   BUN 11 12  CREATININE 0.75 0.70  GLUCOSE 211* 210*  CALCIUM 9.4  --       Elberta Leatherwood, MD 09/04/2015, 2:40 AM PGY-2, [facility] pager: [phone], text pages welcome

## 2015-09-03 NOTE — ED Provider Notes (Signed)
CSN: [id number]     Arrival date & time 09/03/15  1316 History   First MD Initiated Contact with Patient 09/03/15 1834     Chief Complaint  Patient presents with  . Aphasia  . Weakness     (Consider location/radiation/quality/duration/timing/severity/associated sxs/prior Treatment) HPI Patient presents with intermittent difficulty speaking, slurred speech and word saladper her fianc who comes her. As an example he states that today she said the biggest house in my room rather than the biggest room in my house. Patient also complains of diffuse headaches daily, for the past several weeks. No treatment prior to coming here. No nausea or vomiting. No fever. She's not had focal weakness however has had some jerking motions of her arms at times. No other associated symptoms. No treatment prior to coming here. Nothing makes symptoms better or worse Past Medical History  Diagnosis Date  . COPD (chronic obstructive pulmonary disease) ([place])   . Hyperlipidemia   . Asthma   . Hypertension   . Thyroid disease   . Hypothyroid   . Chest pain   . Depression   . GERD (gastroesophageal reflux disease)   . Arthritis     cervical spine DDD  . Liver lesion     "was told 2013" - not an issue   . History of oxygen administration     Oxygen 3 l/m nasally -bedtime and activity   Past Surgical History  Procedure Laterality Date  . Cervical disc surgery      fusion-retained hardware  . Abdominal hysterectomy    . Breast surgery    . Cardiac catheterization N/A 01/10/2015    Procedure: Left Heart Cath and Coronary Angiography;  Surgeon: Lorretta Harp, MD;  Location: [facility];  Service: Cardiovascular;  Laterality: N/A;  . Tubal ligation    . Exploratory laparotomy      "bikini cut" ovarian cyst, "wound dehiscence, scarring."  . Colonoscopy w/ polypectomy      previous hx-"just fount out about past hx polyps.  . Colonoscopy with propofol N/A 08/22/2015    Procedure: COLONOSCOPY WITH  PROPOFOL;  Surgeon: Milus Banister, MD;  Location: [facility];  Service: Endoscopy;  Laterality: N/A;   Family History  Problem Relation Age of Onset  . Coronary artery disease Mother   . Stroke Father   . Prostate cancer Father   . Heart disease Brother   . Emphysema Brother    Social History  Substance Use Topics  . Smoking status: Former Smoker -- 1.50 packs/day for 41 years    Types: Cigarettes    Quit date: 11/30/2011  . Smokeless tobacco: Not on file  . Alcohol Use: No   OB History    No data available     Review of Systems  Neurological: Positive for speech difficulty and headaches.  All other systems reviewed and are negative.     Allergies  Clindamycin/lincomycin and Trazodone and nefazodone  Home Medications   Prior to Admission medications   Medication Sig Start Date End Date Taking? Authorizing Provider  albuterol (PROVENTIL HFA;VENTOLIN HFA) 108 (90 BASE) MCG/ACT inhaler Inhale 2 puffs into the lungs every 6 (six) hours as needed. For shortness of breath.    Historical Provider, MD  ALPRAZolam Duanne Moron) 1 MG tablet Take 1 mg by mouth 3 (three) times daily as needed for anxiety.     Historical Provider, MD  amitriptyline (ELAVIL) 75 MG tablet Take 75 mg by mouth at bedtime as needed for sleep.  Historical Provider, MD  aspirin 81 MG tablet Take 81 mg by mouth daily.    Historical Provider, MD  atenolol (TENORMIN) 50 MG tablet Take 1 tablet by mouth every morning. 05/04/13   Historical Provider, MD  citalopram (CELEXA) 20 MG tablet Take 1 tablet by mouth daily. 11/14/13   Historical Provider, MD  CRESTOR 10 MG tablet Take 1 tablet by mouth daily. 05/10/13   Historical Provider, MD  famotidine (PEPCID) 20 MG tablet TAKE 1 TABLET AT BEDTIME 04/12/15   Tanda Rockers, MD  furosemide (LASIX) 40 MG tablet Take 1 tablet by mouth daily. 11/02/13   Historical Provider, MD  hydrochlorothiazide (HYDRODIURIL) 25 MG tablet Take 1 tablet by mouth daily. 04/27/13   Historical  Provider, MD  levothyroxine (SYNTHROID, LEVOTHROID) 100 MCG tablet Take 100 mcg by mouth daily. 12/11/14   Historical Provider, MD  meloxicam (MOBIC) 7.5 MG tablet Take 1 tablet by mouth daily. 01/16/15   Historical Provider, MD  Na Sulfate-K Sulfate-Mg Sulf SOLN Take 1 kit by mouth once. 08/13/15   Milus Banister, MD  nitroGLYCERIN (NITROSTAT) 0.3 MG SL tablet Place 1 tablet (0.3 mg total) under the tongue every 5 (five) minutes as needed for chest pain. 01/07/15   Tanda Rockers, MD  omeprazole (PRILOSEC) 40 MG capsule Take 40 mg by mouth daily.    Historical Provider, MD  oxyCODONE-acetaminophen (PERCOCET) 5-325 MG per tablet Take 1 tablet by mouth every 4 (four) hours as needed. For pain. 12/03/11   Marianne L York, PA-C  STIOLTO RESPIMAT 2.5-2.5 MCG/ACT AERS INHALE 2 PUFFS INTO THE LUNGS ONCE DAILY 05/24/15   Tanda Rockers, MD   oxygen 3 L nasal cannula BP 106/82 mmHg  Pulse 68  Temp(Src) 98.9 F (37.2 C) (Oral)  Resp 16  SpO2 99% Physical Exam  Constitutional: She is oriented to person, place, and time. She appears well-developed and well-nourished.  HENT:  Head: Normocephalic and atraumatic.  Eyes: Conjunctivae are normal. Pupils are equal, round, and reactive to light.  Neck: Neck supple. No tracheal deviation present. No thyromegaly present.  Cardiovascular: Normal rate and regular rhythm.   No murmur heard. Pulmonary/Chest: Effort normal and breath sounds normal.  Abdominal: Soft. Bowel sounds are normal. She exhibits no distension. There is no tenderness.  Morbidly obese  Musculoskeletal: Normal range of motion. She exhibits no edema or tenderness.  Neurological: She is alert and oriented to person, place, and time. She has normal reflexes. Coordination normal.  DTRs symmetric bilaterally at knee jerk and ankle jerk and biceps has downward going bilaterally. Gait normal Romberg normal pronator drift normal finger to nose normal  Skin: Skin is warm and dry. No rash noted.   Psychiatric: She has a normal mood and affect.  Nursing note and vitals reviewed.   ED Course  Procedures (including critical care time) Labs Review Labs Reviewed  CBC - Abnormal; Notable for the following:    RBC 3.67 (*)    Hemoglobin 10.4 (*)    HCT 34.3 (*)    All other components within normal limits  COMPREHENSIVE METABOLIC PANEL - Abnormal; Notable for the following:    Chloride 93 (*)    CO2 37 (*)    Glucose, Bld 211 (*)    Albumin 3.3 (*)    AST 53 (*)    ALT 55 (*)    Alkaline Phosphatase 149 (*)    All other components within normal limits  I-STAT CHEM 8, ED - Abnormal; Notable for the following:  Chloride 90 (*)    Glucose, Bld 210 (*)    All other components within normal limits  PROTIME-INR  APTT  DIFFERENTIAL  I-STAT TROPOININ, ED    Imaging Review Ct Head Wo Contrast  09/03/2015  CLINICAL DATA:  Memory loss and confusion for several weeks EXAM: CT HEAD WITHOUT CONTRAST TECHNIQUE: Contiguous axial images were obtained from the base of the skull through the vertex without intravenous contrast. COMPARISON:  12/19/2007 FINDINGS: Brain parenchyma, ventricular system, and extra-axial space are within normal limits. No mass effect, midline shift, or acute hemorrhage. Cranium is intact. IMPRESSION: No acute intracranial pathology Electronically Signed   By: Marybelle Killings M.D.   On: 09/03/2015 14:51   I have personally reviewed and evaluated these images and lab results as part of my medical decision-making.   EKG Interpretation   Date/Time:  Tuesday September 03 2015 13:58:17 EST Ventricular Rate:  95 PR Interval:  136 QRS Duration: 72 QT Interval:  348 QTC Calculation: 437 R Axis:   75 Text Interpretation:  Normal sinus rhythm Low voltage QRS Borderline ECG  SINCE LAST TRACING HEART RATE HAS INCREASED Confirmed by Winfred Leeds  MD,  SAM [phone]) on 09/03/2015 6:35:46 PM     Patient passed swallow screen Results for orders placed or performed during the  hospital encounter of 09/03/15  Protime-INR  Result Value Ref Range   Prothrombin Time 13.3 11.6 - 15.2 seconds   INR 0.99 0.00 - 1.49  APTT  Result Value Ref Range   aPTT 27 24 - 37 seconds  CBC  Result Value Ref Range   WBC 7.4 4.0 - 10.5 K/uL   RBC 3.67 (L) 3.87 - 5.11 MIL/uL   Hemoglobin 10.4 (L) 12.0 - 15.0 g/dL   HCT 34.3 (L) 36.0 - 46.0 %   MCV 93.5 78.0 - 100.0 fL   MCH 28.3 26.0 - 34.0 pg   MCHC 30.3 30.0 - 36.0 g/dL   RDW 14.5 11.5 - 15.5 %   Platelets 227 150 - 400 K/uL  Differential  Result Value Ref Range   Neutrophils Relative % 74 %   Neutro Abs 5.5 1.7 - 7.7 K/uL   Lymphocytes Relative 16 %   Lymphs Abs 1.2 0.7 - 4.0 K/uL   Monocytes Relative 7 %   Monocytes Absolute 0.5 0.1 - 1.0 K/uL   Eosinophils Relative 3 %   Eosinophils Absolute 0.2 0.0 - 0.7 K/uL   Basophils Relative 0 %   Basophils Absolute 0.0 0.0 - 0.1 K/uL  Comprehensive metabolic panel  Result Value Ref Range   Sodium 138 135 - 145 mmol/L   Potassium 4.3 3.5 - 5.1 mmol/L   Chloride 93 (L) 101 - 111 mmol/L   CO2 37 (H) 22 - 32 mmol/L   Glucose, Bld 211 (H) 65 - 99 mg/dL   BUN 11 6 - 20 mg/dL   Creatinine, Ser 0.75 0.44 - 1.00 mg/dL   Calcium 9.4 8.9 - 10.3 mg/dL   Total Protein 6.8 6.5 - 8.1 g/dL   Albumin 3.3 (L) 3.5 - 5.0 g/dL   AST 53 (H) 15 - 41 U/L   ALT 55 (H) 14 - 54 U/L   Alkaline Phosphatase 149 (H) 38 - 126 U/L   Total Bilirubin 0.3 0.3 - 1.2 mg/dL   GFR calc non Af Amer >60 >60 mL/min   GFR calc Af Amer >60 >60 mL/min   Anion gap 8 5 - 15  I-stat troponin, ED (not at [facility], [facility])  Result  Value Ref Range   Troponin i, poc 0.00 0.00 - 0.08 ng/mL   Comment 3          I-Stat Chem 8, ED  (not at [facility], [facility])  Result Value Ref Range   Sodium 136 135 - 145 mmol/L   Potassium 4.3 3.5 - 5.1 mmol/L   Chloride 90 (L) 101 - 111 mmol/L   BUN 12 6 - 20 mg/dL   Creatinine, Ser 0.70 0.44 - 1.00 mg/dL   Glucose, Bld 210 (H) 65 - 99 mg/dL   Calcium, Ion 1.15 1.12 - 1.23 mmol/L   TCO2 37 0  - 100 mmol/L   Hemoglobin 12.6 12.0 - 15.0 g/dL   HCT 37.0 36.0 - 46.0 %   Ct Head Wo Contrast  09/03/2015  CLINICAL DATA:  Memory loss and confusion for several weeks EXAM: CT HEAD WITHOUT CONTRAST TECHNIQUE: Contiguous axial images were obtained from the base of the skull through the vertex without intravenous contrast. COMPARISON:  12/19/2007 FINDINGS: Brain parenchyma, ventricular system, and extra-axial space are within normal limits. No mass effect, midline shift, or acute hemorrhage. Cranium is intact. IMPRESSION: No acute intracranial pathology Electronically Signed   By: Marybelle Killings M.D.   On: 09/03/2015 14:51   Mm Screening Breast Tomo Bilateral  08/28/2015  CLINICAL DATA:  Screening. EXAM: DIGITAL SCREENING BILATERAL MAMMOGRAM WITH 3D TOMO WITH CAD COMPARISON:  Previous exam(s). ACR Breast Density Category b: There are scattered areas of fibroglandular density. FINDINGS: There are no findings suspicious for malignancy. Images were processed with CAD. IMPRESSION: No mammographic evidence of malignancy. A result letter of this screening mammogram will be mailed directly to the patient. RECOMMENDATION: Screening mammogram in one year. (Code:SM-B-01Y) BI-RADS CATEGORY  1: Negative. Electronically Signed   By: Ammie Ferrier M.D.   On: 08/28/2015 09:00  administered asa here  MDM  Stroke risk factors include hypertension, hypercholesterolemia, ex-smoker Final diagnoses:  None   clinically patient with transient ischemic attacks. Dr Alease Frame consulted will see patient in ED. Plan 23 hour observation, telemetry. Patient will need evaluation likely to include echocardiogram, MRI of brain, carotid Dopplers Dx #1 Tia #2 hyperglycemia #3anemia     Orlie Dakin, MD 09/03/15 1921

## 2015-09-04 ENCOUNTER — Observation Stay (HOSPITAL_COMMUNITY): Payer: Medicare Other

## 2015-09-04 ENCOUNTER — Observation Stay (HOSPITAL_COMMUNITY)
Admit: 2015-09-04 | Discharge: 2015-09-04 | Disposition: A | Discharge status: Home / Self Care | Payer: Medicare Other | Attending: Neurology | Admitting: Neurology

## 2015-09-04 DIAGNOSIS — R4781 Slurred speech: Secondary | ICD-10-CM | POA: Diagnosis not present

## 2015-09-04 DIAGNOSIS — G9340 Encephalopathy, unspecified: Secondary | ICD-10-CM

## 2015-09-04 DIAGNOSIS — I10 Essential (primary) hypertension: Secondary | ICD-10-CM

## 2015-09-04 DIAGNOSIS — R410 Disorientation, unspecified: Secondary | ICD-10-CM | POA: Diagnosis not present

## 2015-09-04 DIAGNOSIS — G459 Transient cerebral ischemic attack, unspecified: Secondary | ICD-10-CM

## 2015-09-04 DIAGNOSIS — J449 Chronic obstructive pulmonary disease, unspecified: Secondary | ICD-10-CM

## 2015-09-04 DIAGNOSIS — R739 Hyperglycemia, unspecified: Secondary | ICD-10-CM | POA: Insufficient documentation

## 2015-09-04 LAB — BASIC METABOLIC PANEL
Anion gap: 8 (ref 5–15)
BUN: 7 mg/dL (ref 6–20)
CHLORIDE: 96 mmol/L — AB (ref 101–111)
CO2: 39 mmol/L — AB (ref 22–32)
CREATININE: 0.69 mg/dL (ref 0.44–1.00)
Calcium: 9.3 mg/dL (ref 8.9–10.3)
GFR calc Af Amer: >60 mL/min (ref >60)
GFR calc non Af Amer: >60 mL/min (ref >60)
GLUCOSE: 188 mg/dL — AB (ref 65–99)
POTASSIUM: 4 mmol/L (ref 3.5–5.1)
SODIUM: 143 mmol/L (ref 135–145)

## 2015-09-04 LAB — CBC
HEMATOCRIT: 32.1 % — AB (ref 36.0–46.0)
Hemoglobin: 10.1 g/dL — ABNORMAL LOW (ref 12.0–15.0)
MCH: 29.3 pg (ref 26.0–34.0)
MCHC: 31.5 g/dL (ref 30.0–36.0)
MCV: 93 fL (ref 78.0–100.0)
PLATELETS: 196 10*3/uL (ref 150–400)
RBC: 3.45 MIL/uL — ABNORMAL LOW (ref 3.87–5.11)
RDW: 14.6 % (ref 11.5–15.5)
WBC: 6.8 10*3/uL (ref 4.0–10.5)

## 2015-09-04 LAB — RAPID URINE DRUG SCREEN, HOSP PERFORMED
Amphetamines: NOT DETECTED
BARBITURATES: NOT DETECTED
Benzodiazepines: POSITIVE — AB
Cocaine: NOT DETECTED
Opiates: NOT DETECTED
Tetrahydrocannabinol: NOT DETECTED

## 2015-09-04 LAB — TSH: TSH: 1.152 u[IU]/mL (ref 0.350–4.500)

## 2015-09-04 LAB — AMMONIA: AMMONIA: 10 umol/L (ref 9–35)

## 2015-09-04 NOTE — Consult Note (Signed)
Neurology Consultation Reason for Consult: Altered mental status Referring Physician: Robb Matar, D  CC: Altered mental status  History is obtained from: Patient, husband  HPI: Stephanie Buck is a 60 y.o. female who has been having waxing and waning altered mental status for the past few weeks. Her husband states that at times she gets very confused and will do things that she does not remember doing. He states that during these episodes, she doesn't make sense and will talk to people or things that are not there. He states that she acts out apparent dreams, sometimes violently and that this is only been happening over the past few weeks.  She also describes muscle jerks that happen intermittently. She states that she'll be holding something in her normal jerk and she will drop whatever dose that she is holding. This is nonsustained rhythmic jerking, just intermittent isolated jerks.  Her husband also notes that her memory seems like it is getting progressively worse over the past few weeks as well.   LKW: 3 weeks ago tpa given?: no, outside of window   ROS: A 14 point ROS was performed and is negative except as noted in the HPI.   Past Medical History  Diagnosis Date  . COPD (chronic obstructive pulmonary disease) ([facility])   . Hyperlipidemia   . Asthma   . Hypertension   . Thyroid disease   . Hypothyroid   . Chest pain   . Depression   . GERD (gastroesophageal reflux disease)   . Arthritis     cervical spine DDD  . Liver lesion     "was told 2013" - not an issue   . History of oxygen administration     Oxygen 3 l/m nasally -bedtime and activity     Family History  Problem Relation Age of Onset  . Coronary artery disease Mother   . Stroke Father   . Prostate cancer Father   . Heart disease Brother   . Emphysema Brother      Social History:  reports that she quit smoking about 3 years ago. Her smoking use included Cigarettes. She has a 61.5 pack-year smoking history. She  does not have any smokeless tobacco history on file. She reports that she does not drink alcohol or use illicit drugs.   Exam: Current vital signs: BP 118/71 mmHg  Pulse 92  Temp(Src) 97.9 F (36.6 C) (Oral)  Resp 18  Ht 5' 4.5" (1.638 m)  Wt 100.971 kg (222 lb 9.6 oz)  BMI 37.63 kg/m2  SpO2 98% Vital signs in last 24 hours: Temp:  [97.9 F (36.6 C)-98.9 F (37.2 C)] 97.9 F (36.6 C) (03/07 2246) Pulse Rate:  [68-96] 92 (03/07 2246) Resp:  [16-21] 18 (03/07 2246) BP: (106-137)/(57-82) 118/71 mmHg (03/07 2246) SpO2:  [98 %-100 %] 98 % (03/07 2130) Weight:  [100.971 kg (222 lb 9.6 oz)] 100.971 kg (222 lb 9.6 oz) (03/07 2246)   Physical Exam  Constitutional: Appears older than stated age Psych: Affect appropriate to situation Eyes: No scleral injection HENT: No OP obstrucion Head: Normocephalic.  Cardiovascular: Normal rate and regular rhythm.  Respiratory: Effort normal GI: Soft.  No distension. There is no tenderness.  Skin: WDI  Neuro: Mental Status: Patient is awake, alert, oriented to person, place, month, year, and situation. Patient is able to give a clear and coherent history. No signs of aphasia or neglect Cranial Nerves: II: Visual Fields are full. Pupils are equal, round, and reactive to light.   III,IV, VI:  EOMI without ptosis or diploplia.  V: Facial sensation is symmetric to temperature VII: Facial movement is symmetric.  VIII: hearing is intact to voice X: Uvula elevates symmetrically XI: Shoulder shrug is symmetric. XII: tongue is midline without atrophy or fasciculations.  Motor: Tone is normal. Bulk is normal. 5/5 strength was present in all four extremities.  Sensory: Sensation is symmetric to light touch and temperature in the arms and legs. Cerebellar: FNF intact bilaterally    I have reviewed labs in epic and the results pertinent to this consultation are: BUN-normal  I have reviewed the images obtained: CT head-no acute  findings  Impression: 60 year old female with what sounds like delirium and myoclonus. This is new onset, and further evaluation will need to be performed. Given the waxing and waning nature, I do wonder about medication effect and she is on Xanax as well as oxycodone, but has been on both of these for long periods of time. She does have a history of liver lesion and hepatic encephalopathy can certainly present like this. She will need to be evaluated for this with pneumonia.  I do not think her history is consistent with TIA. Stroke workup only if MRI is positive  Recommendations: 1) MRI brain 2) EEG 3) ammonia 4) neurology will continue to follow   Ritta SlotMcNeill Kirkpatrick, MD [facility] [phone]  If 7pm- 7am, please page neurology on call as listed in AMION.

## 2015-09-04 NOTE — Progress Notes (Signed)
EEG completed; results pending.    

## 2015-09-04 NOTE — Progress Notes (Signed)
Family Medicine Teaching Service Daily Progress Note Intern Pager: [phone]  Patient name: Stephanie Buck Medical record number: [id number] Date of birth: [birth year] Age: 60 y.o. Gender: female  Primary Care Provider: Quitman LivingsHASSAN,SAMI, MD Consultants: Neurology Code Status: Full  Pt Overview and Major Events to Date:  Admitted 09/03/15 for slurred speech (x2 weeks)  Assessment and Plan: Stephanie BornRegina J Gemma is a 60 y.o. female presenting with intermittent slurred speech and confusion 2 weeks. PMH is significant for COPD, hypertension, hyperlipidemia, chronic pain syndrome, morbid obesity, 50+ pack/year smoking history.  # Slurred speech, concerning for TIA: Patient endorses frequent episodes of confusion and slurred speech. Etiology currently unknown, however differential includes TIA versus seizure disorder versus MS versus medication-induced versus substance induced versus psychogenic. Could also consider undiagnosed OSA. No focal deficits on exam. CT head without acute pathology. No new medications reported by patient. Patient denies substance use. Neurology following and recommends work-up for seizures. TIA work-up if MRI abnormal.  - Telemetry - Consulted neurology, appreciate recommendations.  - Ammonia level --> 10 - EEG ordered - MRI brain without contrast pending - UDS positive for benzodiazepines (on Xanax); EtOH < 5 - Daily aspirin - Swallow study passed in ED  # COPD: Requires supplemental O2 at home. Physical exam yielded some occasional wheeze in bilateral lung fields. No crackles present. At this time no significant concern for exacerbation. - Supplemental O2 as needed - Continue home albuterol - Started Spiriva - Consider initiating controller medication as an outpatient  # Hypertension: Well-controlled since admission (120s/50s) - Continue home atenolol, Lasix - EKG benign  # Hyperlipidemia - Continue home Crestor  # Hypothyroidism - Continue home Synthroid - TSH  1.152  # GERD - Protonix and Pepcid  # ??Depression/Anxiety - Continue home Celexa - Holding Xanax and amitriptyline  FEN/GI: IV fluids KVO; heart healthy carb modified diet Prophylaxis: Lovenox  Disposition: Home pending medical improvement and completion of work-up  Subjective:  Patient has no complaints. No current headache. She says she did get the days confused earlier today (thought it was Friday). She says she has been trying to think about what could have triggered her symptoms and does report that she only recently began taking her elavil about 6 weeks ago, whereas she had not been taking it beforehand despite its being prescribed. She reports that she does snore but gets about 8 hours of sleep a night and feels well rested in the morning.   Objective: Temp:  [97.6 F (36.4 C)-98.9 F (37.2 C)] 98 F (36.7 C) (03/08 0513) Pulse Rate:  [68-96] 95 (03/08 0522) Resp:  [16-21] 18 (03/08 0513) BP: (106-137)/(52-82) 122/68 mmHg (03/08 0522) SpO2:  [97 %-100 %] 99 % (03/08 0513) Weight:  [222 lb 9.6 oz (100.971 kg)] 222 lb 9.6 oz (100.971 kg) (03/07 2246) Physical Exam: General: Well-appearing, obese female, sitting up in bed eating lunch; [place] in place Cardiovascular: RRR, S1, S2, no m/r/g Chest: Lungs CTAB. No increased WOB. Speaking in complete sentences.  Abdomen: +BS, S, NT, ND Extremities: WWP. Strength intact. Neuro: AOx3. No focal deficits on neuro exam.   Laboratory:  Recent Labs Lab 09/03/15 1358 09/03/15 1409 09/04/15 0225  WBC 7.4  --  6.8  HGB 10.4* 12.6 10.1*  HCT 34.3* 37.0 32.1*  PLT 227  --  196    Recent Labs Lab 09/03/15 1358 09/03/15 1409 09/04/15 0225  NA 138 136 143  K 4.3 4.3 4.0  CL 93* 90* 96*  CO2 37*  --  39*  BUN CREATININE 0.75 0.70 0.69  CALCIUM 9.4  --  9.3  PROT 6.8  --   --   BILITOT 0.3  --   --   ALKPHOS 149*  --   --   ALT 55*  --   --   AST 53*  --   --   GLUCOSE 211* 210* 188*    Imaging/Diagnostic  Tests: Ct Head Wo Contrast  09/03/2015  CLINICAL DATA:  Memory loss and confusion for several weeks EXAM: CT HEAD WITHOUT CONTRAST TECHNIQUE: Contiguous axial images were obtained from the base of the skull through the vertex without intravenous contrast. COMPARISON:  12/19/2007 FINDINGS: Brain parenchyma, ventricular system, and extra-axial space are within normal limits. No mass effect, midline shift, or acute hemorrhage. Cranium is intact. IMPRESSION: No acute intracranial pathology Electronically Signed   By: Jolaine Click M.D.   On: 09/03/2015 14:51    Kenshawn Maciolek Percell Boston, MD 09/04/2015, 7:07 AM PGY-1, [facility] Family Medicine FPTS Intern pager: [phone], text pages welcome

## 2015-09-04 NOTE — Progress Notes (Signed)
Pt has a huge abdomen. She states at times she can be 20 pounds heavier. Abdomin is tender to palpate, and firm. She states that the " swelling in her abdomin " is horrible at times. Bowel sounds present x 4. Had a loose BM today.

## 2015-09-04 NOTE — Progress Notes (Signed)
Subjective: Has no complaints feels back to baseline. She feels the slurred speech and AMS was due to Elavil.   Exam: Filed Vitals:   09/04/15 0707 09/04/15 1309  BP: 123/54 125/54  Pulse: 86 72  Temp: 98 F (36.7 C) 98.4 F (36.9 C)  Resp: 20 20        Gen: In bed, NAD MS: Alert and oriented CN: 2-12 intact Motor: MAEW Sensory: Intact   Pertinent Labs: MRI negative EEG pending  Felicie MornDavid Smith PA-C [facility] [phone]  Impression: transient AMS which has fully resolved. Patietn wonders if this may be due to her Elavil.    Recommendations: 1) Discontinue Elavil 2) Awaiting EEG reading--if negative Neurology S/O    09/04/2015, 2:58 PM

## 2015-09-04 NOTE — Care Management Note (Signed)
Case Management Note  Patient Details  Name: Stevan BornRegina J Mooneyham MRN: [id number] Date of Birth: 02-02-1956  Subjective/Objective:    Patient admitted with TIA. MRI results negative. Patient is from home with her spouse and uses oxygen with activity and at night.                 Action/Plan: Await further testing. CM following for discharge needs.   Expected Discharge Date:                  Expected Discharge Plan:     In-House Referral:     Discharge planning Services     Post Acute Care Choice:    Choice offered to:     DME Arranged:    DME Agency:     HH Arranged:    HH Agency:     Status of Service:     Medicare Important Message Given:    Date Medicare IM Given:    Medicare IM give by:    Date Additional Medicare IM Given:    Additional Medicare Important Message give by:     If discussed at Long Length of Stay Meetings, dates discussed:    Additional Comments:  Kermit BaloKelli F Oswaldo Cueto, RN 09/04/2015, 3:34 PM

## 2015-09-05 DIAGNOSIS — G9340 Encephalopathy, unspecified: Secondary | ICD-10-CM | POA: Diagnosis not present

## 2015-09-05 DIAGNOSIS — R4781 Slurred speech: Secondary | ICD-10-CM | POA: Diagnosis not present

## 2015-09-05 LAB — HEMOGLOBIN A1C
Hgb A1c MFr Bld: 7.3 % — ABNORMAL HIGH (ref 4.8–5.6)
Mean Plasma Glucose: 163 mg/dL

## 2015-09-05 MED ORDER — ASPIRIN 81 MG PO TBEC: 81.0000 mg | DELAYED_RELEASE_TABLET | Freq: Every day | ORAL | Status: DC

## 2015-09-05 MED ORDER — ALBUTEROL SULFATE (2.5 MG/3ML) 0.083% IN NEBU
2.5000 mg | INHALATION_SOLUTION | Freq: Once | RESPIRATORY_TRACT | Status: DC
  Filled 2015-09-05: qty 3

## 2015-09-05 MED ORDER — ALPRAZOLAM 0.5 MG PO TABS
1.0000 mg | ORAL_TABLET | Freq: Once | ORAL | Status: AC
  Administered 2015-09-05: 1 mg via ORAL
  Filled 2015-09-05: qty 2

## 2015-09-05 NOTE — Progress Notes (Signed)
Family Medicine Teaching Service Daily Progress Note Intern Pager: [phone]  Patient name: Stephanie Buck Medical record number: [id number] Date of birth: 1955/09/05 Age: 60 y.o. Gender: female  Primary Care Provider: Quitman Livings, MD Consultants: Neurology Code Status: Full  Pt Overview and Major Events to Date:  Admitted 09/03/15 for slurred speech (x2 weeks)  Assessment and Plan: Stephanie Buck is a 60 y.o. female presenting with intermittent slurred speech and confusion 2 weeks. PMH is significant for COPD, hypertension, hyperlipidemia, chronic pain syndrome, morbid obesity, 50+ pack/year smoking history. Imaging not concerning for TIA. Awaiting EEG results. Clarified that headache is chronic after neck surgery.  # Slurred speech, concerning for TIA: Resolved but still with some confusion (thought wrong day of week). Etiology currently unknown, however initial differential included TIA versus seizure disorder versus MS versus medication-induced versus substance induced versus psychogenic. Could also consider undiagnosed OSA. No focal deficits on exam. CT head, MRI brain without acute pathology. Patient denies substance use but takes percocet q4h for pain at home and xanax TID prn for anxiety. Neurology following and recommended work-up for seizures. Elavil most likely cause of symptoms.  - Discontinue telemetry - Consulted neurology, appreciate recommendations.  - Ammonia level --> 10 - EEG ordered --> results pending - UDS positive for benzodiazepines, not opiates (on Xanax); EtOH < 5 - Daily aspirin - Swallow study passed in ED  # COPD: Requires supplemental O2 at home. Physical exam yielded some occasional wheeze in bilateral lung fields. No crackles present. At this time no significant concern for exacerbation. - Supplemental O2 as needed - Continue home albuterol - Started Spiriva - Consider initiating controller medication as an outpatient  # Hypertension: Well-controlled  since admission (120s/50s) - Continue home atenolol, Lasix - EKG benign  # Hyperlipidemia - Continue home Crestor  # Hypothyroidism - Continue home Synthroid - TSH 1.152  # GERD - Protonix and Pepcid  # ??Depression/Anxiety - Continue home Celexa - Holding Xanax and amitriptyline  Diabetes: Hgb A1c - Discussed results with patient. She says she has gained 60 lbs over the last few years after her cervical disc surgery.  - To discuss starting metformin with PCP  FEN/GI: IV fluids KVO; heart healthy carb modified diet Prophylaxis: Lovenox  Disposition: Will discharge home today and call if EEG is abnormal  Subjective:  Patient had concern for increased sputum and cough overnight. Did not receive any of her prn breathing treatments.   Objective: Temp:  [97.6 F (36.4 C)-98.4 F (36.9 C)] 97.8 F (36.6 C) (03/09 0549) Pulse Rate:  [68-74] 71 (03/09 0549) Resp:  [16-20] 18 (03/09 0549) BP: (108-125)/(49-64) 108/64 mmHg (03/09 0549) SpO2:  [98 %-99 %] 98 % (03/09 0549) Physical Exam: General: Well-appearing, obese female, sitting up in bed eating lunch; [place] in place Cardiovascular: RRR, S1, S2, no m/r/g Chest: Expiratory wheeze over lower lung field. No increased WOB. Speaking in complete sentences.  Abdomen: +BS, S, NT, ND Extremities: WWP. Strength intact. Neuro: AOx3. No focal deficits on neuro exam.   Laboratory:  Recent Labs Lab 09/03/15 1358 09/03/15 1409 09/04/15 0225  WBC 7.4  --  6.8  HGB 10.4* 12.6 10.1*  HCT 34.3* 37.0 32.1*  PLT 227  --  196    Recent Labs Lab 09/03/15 1358 09/03/15 1409 09/04/15 0225  NA 138 136 143  K 4.3 4.3 4.0  CL 93* 90* 96*  CO2 37*  --  39*  BUN CREATININE 0.75 0.70 0.69  CALCIUM 9.4  --  9.3  PROT 6.8  --   --   BILITOT 0.3  --   --   ALKPHOS 149*  --   --   ALT 55*  --   --   AST 53*  --   --   GLUCOSE 211* 210* 188*    Imaging/Diagnostic Tests: Ct Head Wo Contrast  09/03/2015  CLINICAL DATA:   Memory loss and confusion for several weeks EXAM: CT HEAD WITHOUT CONTRAST TECHNIQUE: Contiguous axial images were obtained from the base of the skull through the vertex without intravenous contrast. COMPARISON:  12/19/2007 FINDINGS: Brain parenchyma, ventricular system, and extra-axial space are within normal limits. No mass effect, midline shift, or acute hemorrhage. Cranium is intact. IMPRESSION: No acute intracranial pathology Electronically Signed   By: Jolaine ClickArthur  Hoss M.D.   On: 09/03/2015 14:51    Bruna Dills Percell BostonMoen Alyssamarie Mounsey, MD 09/05/2015, 7:36 AM PGY-1, [facility] FPTS Intern pager: [phone], text pages welcome

## 2015-09-05 NOTE — Progress Notes (Signed)
Patient voice concerns about her "getting pneumonia" while in the hospital and wanted to know why MD is not prescribing her "percocet and valium". RN encourages ambulation at this time. ICS provided and educated.  Lung sounds noted wheezing at bilateral lower base. Pt with a little nonproductive weak cough a tthis time. Pt requested her rescue inhaler to be present at bedside. MD paged r/t concerns. Pt refused bath/shower at this time until seen by MD.   Sim BoastHavy. RN

## 2015-09-05 NOTE — Discharge Instructions (Signed)
Ms. Stephanie Buck,  You were admitted to work-up confusion and slurred speech. Brain imaging was all normal. The electroencephalogram (EEG) to look for seizure activity is still pending. I will call you if the results are abnormal. However, the most likely cause was your medication amitriptyline (elavil). Please STOP taking amitriptyline when you are home.   Your blood sugars were also found to be elevated with a hemoglobin A1c of 7.3, which is consistent with diabetes. Please discuss starting metformin with your primary doctor. Weight loss and diet can significantly improve your blood sugars.   Diabetes Mellitus and Food It is important for you to manage your blood sugar (glucose) level. Your blood glucose level can be greatly affected by what you eat. Eating healthier foods in the appropriate amounts throughout the day at about the same time each day will help you control your blood glucose level. It can also help slow or prevent worsening of your diabetes mellitus. Healthy eating may even help you improve the level of your blood pressure and reach or maintain a healthy weight.  General recommendations for healthful eating and cooking habits include:  Eating meals and snacks regularly. Avoid going long periods of time without eating to lose weight.  Eating a diet that consists mainly of plant-based foods, such as fruits, vegetables, nuts, legumes, and whole grains.  Using low-heat cooking methods, such as baking, instead of high-heat cooking methods, such as deep frying. Work with your dietitian to make sure you understand how to use the Nutrition Facts information on food labels. HOW CAN FOOD AFFECT ME? Carbohydrates Carbohydrates affect your blood glucose level more than any other type of food. Your dietitian will help you determine how many carbohydrates to eat at each meal and teach you how to count carbohydrates. Counting carbohydrates is important to keep your blood glucose at a healthy level,  especially if you are using insulin or taking certain medicines for diabetes mellitus. Alcohol Alcohol can cause sudden decreases in blood glucose (hypoglycemia), especially if you use insulin or take certain medicines for diabetes mellitus. Hypoglycemia can be a life-threatening condition. Symptoms of hypoglycemia (sleepiness, dizziness, and disorientation) are similar to symptoms of having too much alcohol.  If your health care provider has given you approval to drink alcohol, do so in moderation and use the following guidelines:  Women should not have more than one drink per day, and men should not have more than two drinks per day. One drink is equal to:  12 oz of beer.  5 oz of wine.  1 oz of hard liquor.  Do not drink on an empty stomach.  Keep yourself hydrated. Have water, diet soda, or unsweetened iced tea.  Regular soda, juice, and other mixers might contain a lot of carbohydrates and should be counted. WHAT FOODS ARE NOT RECOMMENDED? As you make food choices, it is important to remember that all foods are not the same. Some foods have fewer nutrients per serving than other foods, even though they might have the same number of calories or carbohydrates. It is difficult to get your body what it needs when you eat foods with fewer nutrients. Examples of foods that you should avoid that are high in calories and carbohydrates but low in nutrients include:  Trans fats (most processed foods list trans fats on the Nutrition Facts label).  Regular soda.  Juice.  Candy.  Sweets, such as cake, pie, doughnuts, and cookies.  Fried foods. WHAT FOODS CAN I EAT? Eat nutrient-rich foods, which will  nourish your body and keep you healthy. The food you should eat also will depend on several factors, including:  The calories you need.  The medicines you take.  Your weight.  Your blood glucose level.  Your blood pressure level.  Your cholesterol level. You should eat a variety of  foods, including:  Protein.  Lean cuts of meat.  Proteins low in saturated fats, such as fish, egg whites, and beans. Avoid processed meats.  Fruits and vegetables.  Fruits and vegetables that may help control blood glucose levels, such as apples, mangoes, and yams.  Dairy products.  Choose fat-free or low-fat dairy products, such as milk, yogurt, and cheese.  Grains, bread, pasta, and rice.  Choose whole grain products, such as multigrain bread, whole oats, and brown rice. These foods may help control blood pressure.  Fats.  Foods containing healthful fats, such as nuts, avocado, olive oil, canola oil, and fish. DOES EVERYONE WITH DIABETES MELLITUS HAVE THE SAME MEAL PLAN? Because every person with diabetes mellitus is different, there is not one meal plan that works for everyone. It is very important that you meet with a dietitian who will help you create a meal plan that is just right for you.   This information is not intended to replace advice given to you by your health care provider. Make sure you discuss any questions you have with your health care provider.   Document Released: 03/12/2005 Document Revised: 07/06/2014 Document Reviewed: 05/12/2013 Elsevier Interactive Patient Education [employer]

## 2015-09-05 NOTE — Progress Notes (Signed)
Subjective: Feels she is getting a cold but no other complaints  Exam: Filed Vitals:   09/05/15 0124 09/05/15 0549  BP: 117/57 108/64  Pulse: 74 71  Temp: 97.6 F (36.4 C) 97.8 F (36.6 C)  Resp: 17 18        Gen: In bed, NAD MS: Alert and oriented CN: 2-12 intact Motor: MAEW Sensory: Intact   Pertinent Labs: MRI negative EEG reading still pending pending  Felicie MornDavid Llewyn Heap PA-C [facility] [phone]  Impression: transient AMS which has fully resolved. Patietn wonders if this may be due to her Elavil.    Recommendations: 1) Discontinue Elavil 2) Awaiting EEG reading--if negative Neurology S/O    09/05/2015, 9:49 AM

## 2015-09-05 NOTE — Progress Notes (Addendum)
Inpatient Diabetes Program Recommendations  AACE/ADA: New Consensus Statement on Inpatient Glycemic Control (2015)  Target Ranges:  Prepandial:   less than 140 mg/dL      Peak postprandial:   less than 180 mg/dL (1-2 hours)      Critically ill patients:  140 - 180 mg/dL   Review of Glycemic Control  Diabetes history: None Current orders for Inpatient glycemic control: None  Inpatient Diabetes Program Recommendations: Correction (SSI): Inpatient goal of <180 mg/dl glucose. Please place patient on CBGs and Novolog Sensitive correction TID. Oral Agents: Consider Metformin Daily at time of d/c. HgbA1C: 7.3% on 09/03/2015. New diagnosis criteria per ADA. Please inform patient and place Diabetes in problem list and place orders for Outpatient Education/Dietician consult/Diabetes consult.  Thanks,  Christena DeemShannon Dimitri Dsouza RN, MSN, [facility] Inpatient Diabetes Coordinator Team Pager [phone] (8a-5p)

## 2015-09-05 NOTE — Progress Notes (Signed)
Discharge instructions reviewed with patient. All questions answered at this time. Awaiting for transport from husband.  Sim BoastHavy, RN

## 2015-09-05 NOTE — Care Management Note (Signed)
Case Management Note  Patient Details  Name: Stevan BornRegina J Haberer MRN: [id number] Date of Birth: 01-22-1956  Subjective/Objective:                    Action/Plan: Patient discharging home with self care. No further needs per CM.   Expected Discharge Date:                  Expected Discharge Plan:  Home/Self Care  In-House Referral:     Discharge planning Services     Post Acute Care Choice:    Choice offered to:     DME Arranged:    DME Agency:     HH Arranged:    HH Agency:     Status of Service:  Completed, signed off  Medicare Important Message Given:    Date Medicare IM Given:    Medicare IM give by:    Date Additional Medicare IM Given:    Additional Medicare Important Message give by:     If discussed at Long Length of Stay Meetings, dates discussed:    Additional Comments:  Kermit BaloKelli F Ellamarie Naeve, RN 09/05/2015, 1:31 PM

## 2015-09-05 NOTE — Discharge Summary (Signed)
[facility] Discharge Summary  Patient name: Stephanie Buck Medical record number: [id number] Date of birth: May 30, 1956 Age: 60 y.o. Gender: female Date of Admission: 09/03/2015  Date of Discharge: 09/05/15 Admitting Physician: Kinnie Feil, MD  Primary Care Provider: Antonietta Jewel, MD Consultants: Neurology  Indication for Hospitalization: slurred speech and confusion  Discharge Diagnoses/Problem List:  Active Problems:   TIA (transient ischemic attack)   Slurred speech   Confusion   Hyperglycemia   Chronic bronchitis with COPD (chronic obstructive pulmonary disease) ([place])  Disposition: Home  Discharge Condition: Improved  Discharge Exam:  Blood pressure 119/63, pulse 64, temperature 97.5 F (36.4 C), temperature source Axillary, resp. rate 18, height 5' 4.5" (1.638 m), weight 222 lb 9.6 oz (100.971 kg), SpO2 99 %. General: Well-appearing, obese female, sitting up in bed eating lunch; [place] in place Cardiovascular: RRR, S1, S2, no m/r/g Chest: Expiratory wheeze over lower lung field. No increased WOB. Speaking in complete sentences.  Abdomen: +BS, S, NT, ND Extremities: WWP. Strength intact. Neuro: AOx3. No focal deficits on neuro exam.   Brief Hospital Course:  Stephanie Buck is a 60 y.o. female who presented with intermittent slurred speech and confusion  several weeks. PMH is significant for COPD, hypertension, hyperlipidemia, chronic pain syndrome, morbid obesity, and 50+ pack/year smoking history.She was admitted to rule-out TIA.   CT head and MRI brain were negative for acute pathology, so no further TIA work-up was pursued. Neurology was consulted and recommended work-up for seizures, so EEG was performed. UDS was positive only for benzodiazepines. EtOH and ammonia WNL. EKG showed NSR. Initially, patient denied any recent medication changes. With further thought, however, patient reported she had just started taking elavil around the time her  symptoms started, though it had been prescribed for a longer period of time. This was reportedly prescribed to help with sleep and at 75 mg. Elavil was discontinued during hospitalization, and symptoms did not recur except for some confusion about day of week. Patient was discharged with instructions not to resume elavil. EEG read was still pending at discharge, but given high suspicion of medication-induced cause of symptoms, patient was allowed to go home.  Issues for Follow Up:  1. New diagnosis of diabetes with hgbA1c of 7.3. Discuss starting metformin. (Not started at discharge, as patient was having some diarrhea.) 2. Consider sleep study to rule-out OSA if symptoms resume  Significant Procedures: EEG performed 09/04/2015  Significant Labs and Imaging:   Recent Labs Lab 09/03/15 1358 09/03/15 1409 09/04/15 0225  WBC 7.4  --  6.8  HGB 10.4* 12.6 10.1*  HCT 34.3* 37.0 32.1*  PLT 227  --  196    Recent Labs Lab 09/03/15 1358 09/03/15 1409 09/04/15 0225  NA 138 136 143  K 4.3 4.3 4.0  CL 93* 90* 96*  CO2 37*  --  39*  GLUCOSE 211* 210* 188*  BUN 11 12 7   CREATININE 0.75 0.70 0.69  CALCIUM 9.4  --  9.3  ALKPHOS 149*  --   --   AST 53*  --   --   ALT 55*  --   --   ALBUMIN 3.3*  --   --    Mr Brain Wo Contrast  09/04/2015  CLINICAL DATA:  The confusion and memory loss for several weeks. EXAM: MRI HEAD WITHOUT CONTRAST TECHNIQUE: Multiplanar, multiecho pulse sequences of the brain and surrounding structures were obtained without intravenous contrast. COMPARISON:  Head CT 09/03/2015 FINDINGS: A subcentimeter focus of  curvilinear, mildly increased signal through the anterior left pons on axial diffusion images is without evidence of corresponding reduced ADC or abnormality on coronal diffusion sequences, felt to be artifactual. There is no evidence of acute infarct, intracranial hemorrhage, mass, midline shift, or extra-axial fluid collection. Ventricles and sulci are normal. No  significant cerebral white matter disease is seen for age. Orbits are unremarkable. Mild paranasal sinus mucosal thickening is noted. The mastoid air cells are clear. Major intracranial vascular flow voids are preserved. IMPRESSION: Unremarkable appearance of the brain. Electronically Signed   By: Logan Bores M.D.   On: 09/04/2015 09:28    Results/Tests Pending at Time of Discharge: EEG report   Discharge Medications:    Medication List    STOP taking these medications        amitriptyline 75 MG tablet  Commonly known as:  ELAVIL      TAKE these medications        albuterol 108 (90 Base) MCG/ACT inhaler  Commonly known as:  PROVENTIL HFA;VENTOLIN HFA  Inhale 2 puffs into the lungs every 6 (six) hours as needed. For shortness of breath.     ALPRAZolam 1 MG tablet  Commonly known as:  XANAX  Take 1 mg by mouth 3 (three) times daily as needed for anxiety.     aspirin 81 MG EC tablet  Take 1 tablet (81 mg total) by mouth daily.     atenolol 50 MG tablet  Commonly known as:  TENORMIN  Take 1 tablet by mouth every morning.     citalopram 20 MG tablet  Commonly known as:  CELEXA  Take 1 tablet by mouth daily.     CRESTOR 10 MG tablet  Generic drug:  rosuvastatin  Take 1 tablet by mouth daily.     famotidine 20 MG tablet  Commonly known as:  PEPCID  TAKE 1 TABLET AT BEDTIME     furosemide 40 MG tablet  Commonly known as:  LASIX  Take 1 tablet by mouth daily.     levothyroxine 100 MCG tablet  Commonly known as:  SYNTHROID, LEVOTHROID  Take 100 mcg by mouth daily.     Na Sulfate-K Sulfate-Mg Sulf Soln  Take 1 kit by mouth once.     nitroGLYCERIN 0.3 MG SL tablet  Commonly known as:  NITROSTAT  Place 1 tablet (0.3 mg total) under the tongue every 5 (five) minutes as needed for chest pain.     omeprazole 40 MG capsule  Commonly known as:  PRILOSEC  Take 40 mg by mouth daily.     oxyCODONE-acetaminophen 5-325 MG tablet  Commonly known as:  PERCOCET/ROXICET  Take 1  tablet by mouth every 4 (four) hours as needed. For pain.     STIOLTO RESPIMAT 2.5-2.5 MCG/ACT Aers  Generic drug:  Tiotropium Bromide-Olodaterol  INHALE 2 PUFFS INTO THE LUNGS ONCE DAILY        Discharge Instructions: Please refer to Patient Instructions section of EMR for full details.  Patient was counseled important signs and symptoms that should prompt return to medical care, changes in medications, dietary instructions, activity restrictions, and follow up appointments.   Follow-Up Appointments:     Follow-up Information    Schedule an appointment as soon as possible for a visit with [facility], MD.   Specialty:  Internal Medicine   Why:  for hopsital follow-up    Contact information:   [address] [place] [address] [phone]       [facility]  Corinda Gubler, MD 09/05/2015, 12:26 PM PGY-1, [place]

## 2015-09-05 NOTE — Care Management Obs Status (Signed)
MEDICARE OBSERVATION STATUS NOTIFICATION   Patient Details  Name: Stephanie Buck MRN: [id number] Date of Birth: 16-Jan-1956   Medicare Observation Status Notification Given:  Yes (MRI results negative)    Kermit BaloKelli F Booker Bhatnagar, RN 09/05/2015, 11:03 AM

## 2015-10-25 ENCOUNTER — Other Ambulatory Visit: Payer: Self-pay | Admitting: Internal Medicine

## 2015-10-31 ENCOUNTER — Ambulatory Visit (INDEPENDENT_AMBULATORY_CARE_PROVIDER_SITE_OTHER)
Admission: RE | Admit: 2015-10-31 | Discharge: 2015-10-31 | Disposition: A | Discharge status: Home / Self Care | Payer: Medicare Other | Source: Ambulatory Visit | Attending: Internal Medicine | Admitting: Internal Medicine

## 2015-10-31 ENCOUNTER — Ambulatory Visit (INDEPENDENT_AMBULATORY_CARE_PROVIDER_SITE_OTHER): Payer: Medicare Other | Admitting: Internal Medicine

## 2015-10-31 ENCOUNTER — Encounter: Payer: Self-pay | Admitting: Internal Medicine

## 2015-10-31 VITALS — BP 104/70 | HR 78 | Ht 64.5 in | Wt 214.0 lb

## 2015-10-31 DIAGNOSIS — J449 Chronic obstructive pulmonary disease, unspecified: Secondary | ICD-10-CM | POA: Diagnosis not present

## 2015-10-31 DIAGNOSIS — J9612 Chronic respiratory failure with hypercapnia: Secondary | ICD-10-CM

## 2015-10-31 NOTE — Patient Instructions (Signed)
Congratulations on the weight loss - keep up the exercises on 02  Please remember to go to the   x-ray department downstairs for your tests - we will call you with the results when they are available.  Please schedule a follow up visit in 6 months but call sooner if needed

## 2015-10-31 NOTE — Progress Notes (Signed)
Subjective:     Patient ID: Stephanie Buck, female   DOB: 18-Jun-1956, 60 y.o.   MRN: [id number]    Brief patient profile:  59   yowf quit smoking 11/2011   @ wt 190 with GOLD III  12/2011 previously followed by [facility] maint with symbicort / spiriva only using 2lpm hs and with ex referred to pulmonary clinic 01/07/15 by Dr Allyson Sabal for ? pna suggested on cxr    History of Present Illness  01/07/2015 1st [facility] office visit/ Sherene Sires  / consult requested by Dr Allyson Sabal  Chief Complaint  Patient presents with  . Acute Visit    Pt c/o fatigue and increased SOB for the past 6 wks. She had cxr on 01/04/15 that she states showed PNA.   nl sleeps on 2 pillows but in recliner x sev months with freq am sore throat    Sob x months/  Cp x 1.73m assoc with diaphoresis/ better on 02 / ? Some better on hfa but very very poor mdi technique. Plan for cath 7/14 Cp is midline/ not pleuritic/ mostly with exertion esp if doesn't use 02 / no radiation or nausea  rec prilosec 20 x 2(= protonox 40 mg)  x 30 min before bfast and 2 before supper and pepcid 20 mg at bedtime  Wear 02 24/7 for now   For chest pain try nitroglycerin sublingual as needed but be sure you are sitting or lying down when you take it,  Not standing GERD diet    02/11/2015 f/u ov/Jaythan Hinely re: GOLD II copd/ chronic hypercabia/ ? OHS component  Chief Complaint  Patient presents with  . Follow-up    Pt states improvement in SOB but not back as baseline. Pt states having a heart catherization 12/2014 with normal results.   Back in a bed instead of recliner  On 2lpm can do [employer]  rec Try off spiriva  (remember it's like high octane fuel, takes while to see the difference) Build up to where you are walking for 30 min without stopping on 02  Please schedule a follow up office visit in 6 weeks, call sooner if needed with pfts on return  Add  consider laba/lama if worse off spiriva    03/25/2015  f/u ov/Jamelia Varano re: GOLD III with reversibility   Chief Complaint  Patient presents with  . Follow-up    PFT done today. Pt reports breathing is getting worse-on her O2 24/7.   Tried off spiriva no change / tried off symb and on spiriva >> much worse  Doe = MMRC3 = can't walk 100 yards even at a slow pace at a flat grade s stopping due to sob   rec Stop symbicort and try BREO one click and Incruse one click each am > go back to symbicort if worsen Work on inhaler technique:       05/01/2015  f/u ov/Merced Hanners re: GOLD III with reversibility and 02 3lpm at hs and activity  On stiolto 2 q am  Chief Complaint  Patient presents with  . Follow-up    Pt states her breathing has improved since the last visit. She uses albuterol inhaler once per day on average.   Doe = MMRC2 = can't walk a nl pace on a flat grade s sob but on 02 can walk up to a half mile slowly flat grade  rec Ok to have colonoscopy  Continue 02 3lpm walking/ 2 lpm sleeping > ok to adjust in daytime and the goal  is keep 90% or greater      08/02/2015  f/u ov/Marua Qin re: GOLD III with reversibility on stiolto and prn 02 but rarely uses  Chief Complaint  Patient presents with  . Follow-up    Pt states that her breathing is doing well.  She rarely uses rescue inhaler. No new co's today.   sleeping well s 02/ no am cough or congestion - Not limited by breathing from desired activities  But relatively sedantary. Says carries 02 just in case she needs it but never does use it, not checking her own sats as agreed last ov rec Ok to have colonoscopy  Continue 02 3lpm walking/ 2 lpm sleeping > ok to adjust in daytime and the goal is keep 90% or greater     10/31/2015  f/u ov/Dezmin Kittelson re:  COPD GOLD III criteria/ maint rx = stiolto  Chief Complaint  Patient presents with  . Follow-up    Pt states breathing is still doing well. She rarely uses albuterol.   hfa is only 50% effective see a/p Sleep ok on 2lpm AM cough/congestion x none Doe = 02 in buggy ok at grocery / wm / no mall/ doing  some ex with stationery bike   No obvious day to day or daytime variability or assoc  excess/ purulent sputum or mucus plugs  or  subjective wheeze or overt sinus or hb symptoms. No unusual exp hx or h/o childhood pna/ asthma or knowledge of premature birth.    Also denies any obvious fluctuation of symptoms with weather or environmental changes or other aggravating or alleviating factors except as outlined above   Current Medications, Allergies, Complete Past Medical History, Past Surgical History, Family History, and Social History were reviewed in [facility] electronic medical record.  ROS  The following are not active complaints unless bolded sore throat, dysphagia, dental problems, itching, sneezing,  nasal congestion or excess/ purulent secretions, ear ache,   fever, chills, sweats, unintended wt loss, classically pleuritic cp, hemoptysis,  orthopnea pnd or leg swelling, presyncope, palpitations, abdominal pain, anorexia, nausea, vomiting, diarrhea  or change in bowel or bladder habits, change in stools or urine, dysuria,hematuria,  rash, arthralgias, visual complaints, headache, numbness, weakness or ataxia or problems with walking or coordination,  change in mood/affect or memory.              Objective:   Physical Exam   amb obese  wf nad  02/11/2015       213 > 03/25/2015  215 > 05/01/2015   219 > 08/02/2015  225 > 10/31/2015 214     01/07/15 208 lb (94.348 kg)  01/04/15 211 lb 12.8 oz (96.072 kg)  12/26/14 205 lb 11.2 oz (93.305 kg)  Baseline wt     150- 165   Vital signs reviewed  HEENT: nl dentition, turbinates, and orophanx. Nl external ear canals without cough reflex   NECK :  without JVD/Nodes/TM/ nl carotid upstrokes bilaterally   LUNGS: no acc muscle use, clear to A and P bilaterally though bs distant bilaterally    CV:  RRR  no s3 or murmur or increase in P2, no edema   ABD:  soft and nontender with nl excursion in the supine position. No bruits or  organomegaly, bowel sounds nl  MS:  warm without deformities, calf tenderness, cyanosis or clubbing  SKIN: warm and dry without lesions    NEURO:  alert, approp, no deficits      CXR PA and Lateral:  10/31/2015 :    I personally reviewed images and agree with radiology impression as follows:   COPD. There is no evidence of pneumonia nor other active cardiopulmonary disease.        Assessment:

## 2015-11-01 NOTE — Progress Notes (Signed)
Quick Note:  Spoke with pt and notified of results per Dr. Wert. Pt verbalized understanding and denied any questions.  ______ 

## 2015-11-03 NOTE — Assessment & Plan Note (Signed)
-    HC03  35  01/04/15 - 01/07/2015  Walked 2lpm  x 1 laps @ 185 ft each stopped due to  Slow pace / sob and unsteady on feet/ no desat but cp after stopped - 02/11/2015  Walked 2lpm @  2 laps @ 185 ft each stopped due to  Sob at nl pace/ no desats - 03/25/2015   Walked 2lpm x one lap @ 185 stopped due to sob/ desat to 85% > rec 3lpm with walking/ refer to Rehab > declined  - 08/02/2015   Walked RA x one lap @ 185 stopped due to desat to 85% but no sob   As of 10/31/2015 rec 2lpm at hs and 3lpm with walking / reviewed

## 2015-11-03 NOTE — Assessment & Plan Note (Signed)
Quit smoking  2013  Wt  175  PFT's 2013:  FEV1 1.29 (50%), ratio 57, TLC normal, +airtrapping, DLCO 56% pred.  +desat with ambulatory oximetry 11/2013 >> wears with heavy exertional activities.  - 02/11/2015 rec symb 160 2bid and try off spiriva   - PFT's  03/25/2015  FEV1 0.89 (34 % ) ratio 55  p 32 % improvement from saba with DLCO  58 % corrects to 88 % for alv volume   - 03/25/2015 trial of breo/incruse > changed to stiolto due to insurance 03/2015  - 10/31/2015  After extensive coaching HFA effectiveness =    75% from a baseline of 50%   She is relatively well compensated considering severity of dz and concomitant wt gain after stopped smoking > continue stiolto maint rx  Each maintenance medication was reviewed in detail including most importantly the difference between maintenance and as needed and under what circumstances the prns are to be used.  Please see instructions for details which were reviewed in writing and the patient given a copy.

## 2016-01-17 ENCOUNTER — Other Ambulatory Visit: Payer: Self-pay | Admitting: Internal Medicine

## 2016-01-21 ENCOUNTER — Telehealth: Payer: Self-pay | Admitting: Internal Medicine

## 2016-01-21 NOTE — Telephone Encounter (Signed)
LMTCB--do not see where pt was d/c'd from our practice

## 2016-01-22 NOTE — Telephone Encounter (Signed)
Called and spoke with pt and she stated that she received a letter and it stated that her inusurance---[employer] will no longer cover any coverage from out office.  Pt stated that she will need [place] to send a referral to [facility] at [place].  MW please advise. thanks

## 2016-01-22 NOTE — Telephone Encounter (Signed)
LOV note and letter faxed to [facility]. Spoke with pt and advised. Nothing further needed.

## 2016-01-22 NOTE — Telephone Encounter (Signed)
Ok to send last ov with cover letter stating her insurance will no longer cover our practice so she's being referred to one that will

## 2016-05-04 ENCOUNTER — Ambulatory Visit: Payer: Medicare Other | Admitting: Internal Medicine

## 2016-10-07 ENCOUNTER — Other Ambulatory Visit: Payer: Self-pay | Admitting: Internal Medicine

## 2016-10-07 DIAGNOSIS — Z1231 Encounter for screening mammogram for malignant neoplasm of breast: Secondary | ICD-10-CM

## 2016-11-03 ENCOUNTER — Ambulatory Visit: Payer: Medicare Other

## 2017-01-13 ENCOUNTER — Ambulatory Visit
Admission: RE | Admit: 2017-01-13 | Discharge: 2017-01-13 | Disposition: A | Discharge status: Home / Self Care | Payer: Medicare Other | Source: Ambulatory Visit | Attending: Internal Medicine | Admitting: Internal Medicine

## 2017-01-13 DIAGNOSIS — Z1231 Encounter for screening mammogram for malignant neoplasm of breast: Secondary | ICD-10-CM

## 2017-03-12 IMAGING — MG MM SCREENING BREAST TOMO BILATERAL
8 series · 8 of 24 positions shown · non-contrast
Comparison: Previous exam(s).

CLINICAL DATA: Screening.

EXAM:
DIGITAL SCREENING BILATERAL MAMMOGRAM WITH 3D TOMO WITH CAD

[R CC]
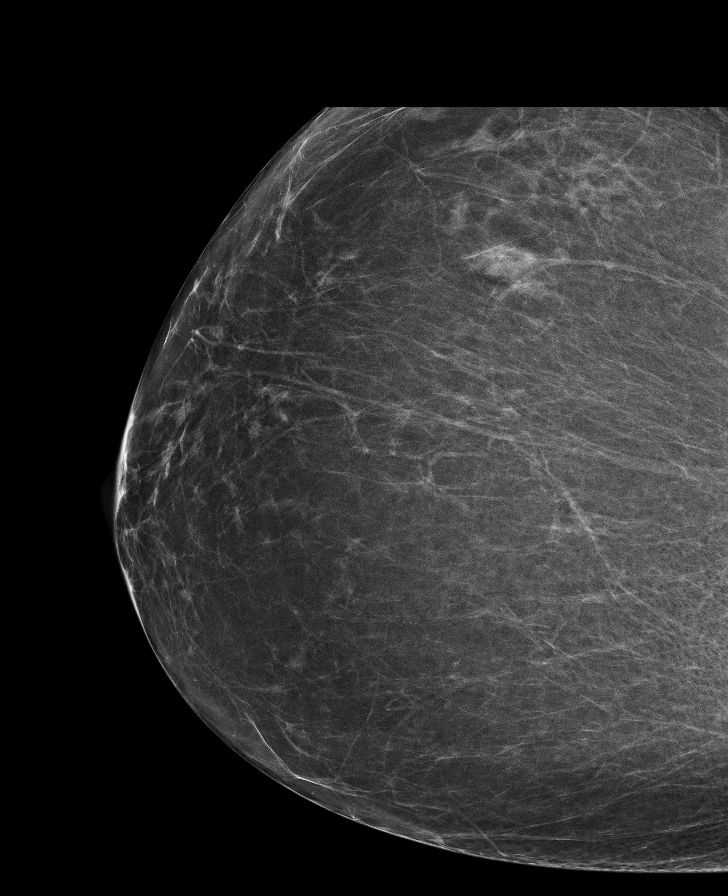

[L MLO]
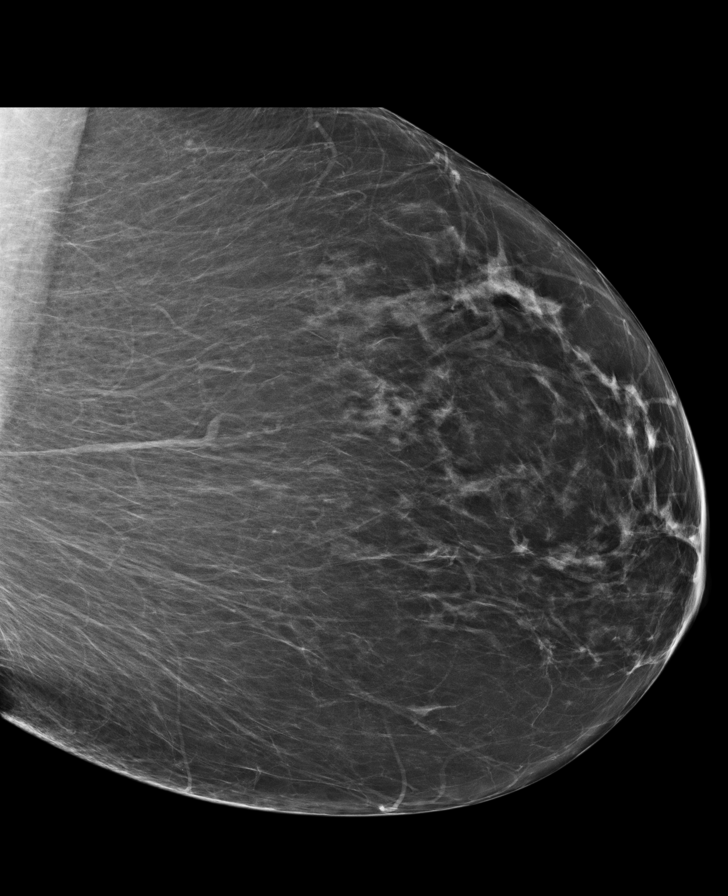

[L CC]
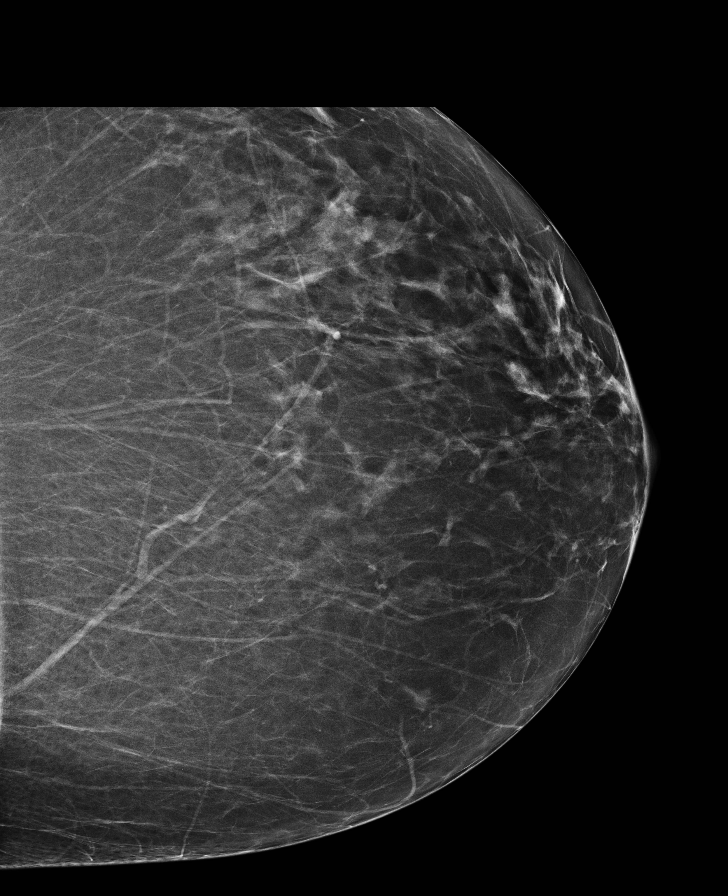

[R MLO]
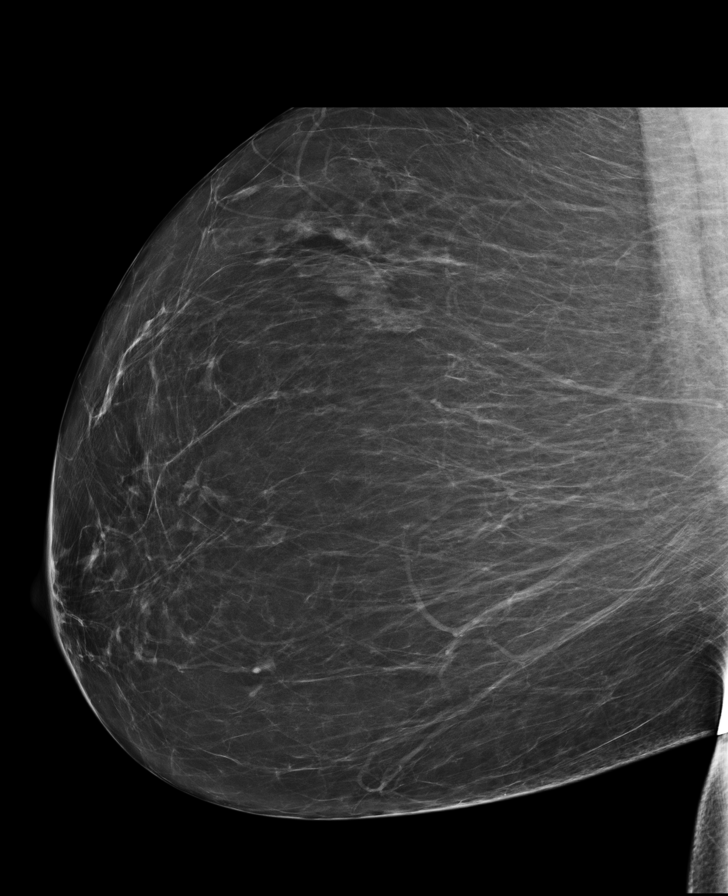

[L MLO tomo · tomo slice 44/87.0]
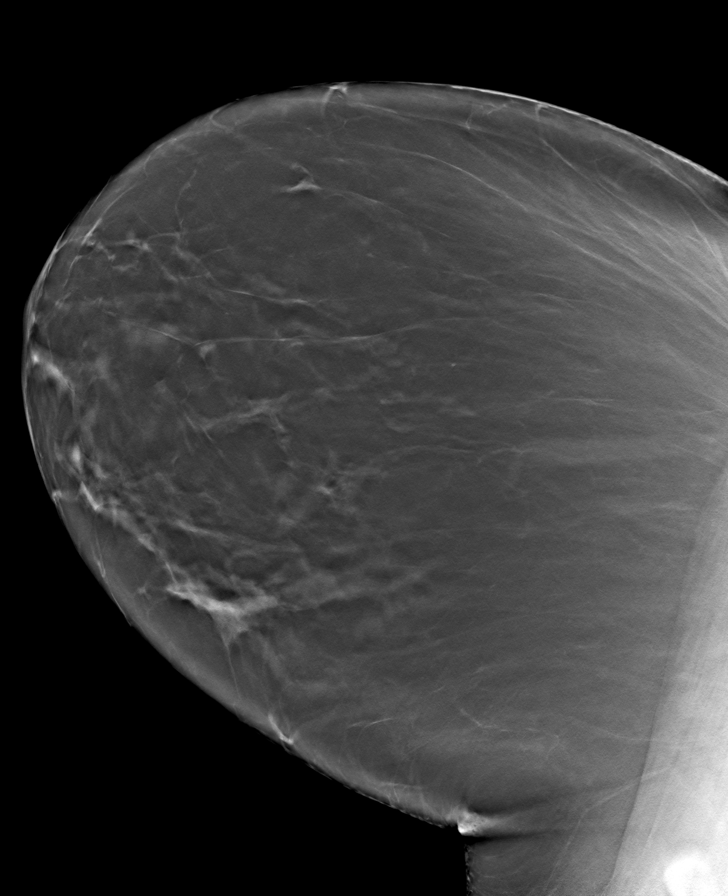

[L CC tomo · tomo slice 35/70.0]
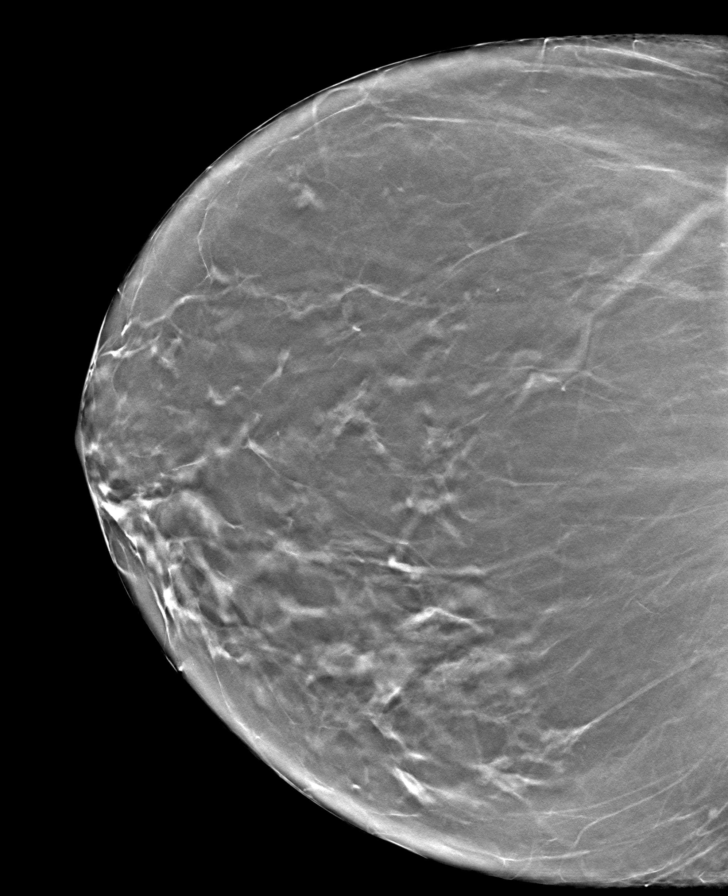

[R CC tomo · tomo slice 37/73.0]
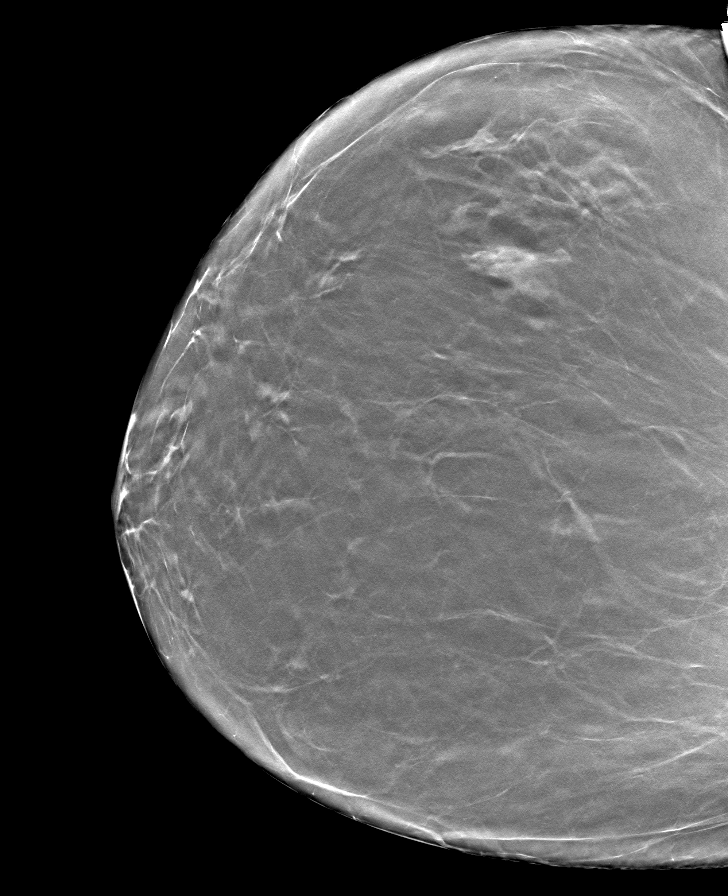

[R MLO tomo · tomo slice 45/90.0]
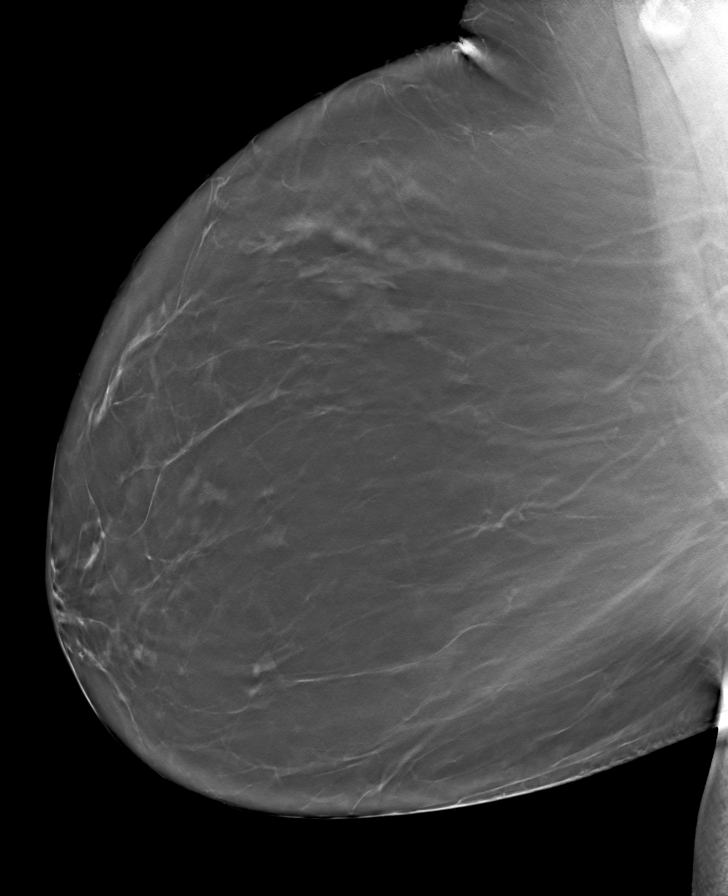

[8 of 24 positions shown; findings below may reference images not displayed]

ACR Breast Density Category b: There are scattered areas of
fibroglandular density.
FINDINGS: There are no findings suspicious for malignancy. Images were
processed with CAD.
IMPRESSION: No mammographic evidence of malignancy. A result letter of this
screening mammogram will be mailed directly to the patient.

RECOMMENDATION:
Screening mammogram in one year. (Code:55-L-23V)

BI-RADS CATEGORY  1: Negative.

## 2018-02-23 ENCOUNTER — Emergency Department (HOSPITAL_COMMUNITY): Admit: 2018-02-23 | Payer: Medicare Other

## 2018-02-23 ENCOUNTER — Emergency Department (HOSPITAL_BASED_OUTPATIENT_CLINIC_OR_DEPARTMENT_OTHER): Payer: Medicare Other

## 2018-02-23 ENCOUNTER — Emergency Department (HOSPITAL_COMMUNITY): Payer: Medicare Other

## 2018-02-23 ENCOUNTER — Encounter (HOSPITAL_COMMUNITY): Payer: Self-pay | Admitting: Emergency Medicine

## 2018-02-23 ENCOUNTER — Emergency Department (HOSPITAL_COMMUNITY)
Admission: EM | Admit: 2018-02-23 | Discharge: 2018-02-23 | Disposition: A | Discharge status: Home / Self Care | Payer: Medicare Other | Attending: Emergency Medicine | Admitting: Emergency Medicine

## 2018-02-23 DIAGNOSIS — I10 Essential (primary) hypertension: Secondary | ICD-10-CM | POA: Insufficient documentation

## 2018-02-23 DIAGNOSIS — Z87891 Personal history of nicotine dependence: Secondary | ICD-10-CM | POA: Insufficient documentation

## 2018-02-23 DIAGNOSIS — Z79899 Other long term (current) drug therapy: Secondary | ICD-10-CM | POA: Insufficient documentation

## 2018-02-23 DIAGNOSIS — R079 Chest pain, unspecified: Secondary | ICD-10-CM | POA: Diagnosis not present

## 2018-02-23 DIAGNOSIS — M79604 Pain in right leg: Secondary | ICD-10-CM | POA: Diagnosis present

## 2018-02-23 DIAGNOSIS — M79609 Pain in unspecified limb: Secondary | ICD-10-CM | POA: Diagnosis not present

## 2018-02-23 DIAGNOSIS — J449 Chronic obstructive pulmonary disease, unspecified: Secondary | ICD-10-CM | POA: Insufficient documentation

## 2018-02-23 DIAGNOSIS — Z8673 Personal history of transient ischemic attack (TIA), and cerebral infarction without residual deficits: Secondary | ICD-10-CM | POA: Insufficient documentation

## 2018-02-23 DIAGNOSIS — Z7984 Long term (current) use of oral hypoglycemic drugs: Secondary | ICD-10-CM | POA: Insufficient documentation

## 2018-02-23 DIAGNOSIS — Z634 Disappearance and death of family member: Secondary | ICD-10-CM | POA: Diagnosis not present

## 2018-02-23 DIAGNOSIS — R600 Localized edema: Secondary | ICD-10-CM | POA: Diagnosis not present

## 2018-02-23 DIAGNOSIS — E039 Hypothyroidism, unspecified: Secondary | ICD-10-CM | POA: Diagnosis not present

## 2018-02-23 DIAGNOSIS — M7989 Other specified soft tissue disorders: Secondary | ICD-10-CM | POA: Diagnosis not present

## 2018-02-23 LAB — CBC
HCT: 42.8 % (ref 36.0–46.0)
HEMOGLOBIN: 13.5 g/dL (ref 12.0–15.0)
MCH: 29 pg (ref 26.0–34.0)
MCHC: 31.5 g/dL (ref 30.0–36.0)
MCV: 91.8 fL (ref 78.0–100.0)
Platelets: 306 10*3/uL (ref 150–400)
RBC: 4.66 MIL/uL (ref 3.87–5.11)
RDW: 14.4 % (ref 11.5–15.5)
WBC: 7.2 10*3/uL (ref 4.0–10.5)

## 2018-02-23 LAB — I-STAT TROPONIN, ED
Troponin i, poc: 0.01 ng/mL (ref 0.00–0.08)
Troponin i, poc: 0 ng/mL (ref 0.00–0.08)

## 2018-02-23 LAB — BASIC METABOLIC PANEL
ANION GAP: 8 (ref 5–15)
BUN: 10 mg/dL (ref 8–23)
CHLORIDE: 107 mmol/L (ref 98–111)
CO2: 25 mmol/L (ref 22–32)
CREATININE: 1.14 mg/dL — AB (ref 0.44–1.00)
Calcium: 9.8 mg/dL (ref 8.9–10.3)
GFR calc non Af Amer: 51 mL/min — ABNORMAL LOW (ref >60)
GFR, EST AFRICAN AMERICAN: 59 mL/min — AB (ref >60)
Glucose, Bld: 123 mg/dL — ABNORMAL HIGH (ref 70–99)
Potassium: 4.1 mmol/L (ref 3.5–5.1)
Sodium: 140 mmol/L (ref 135–145)

## 2018-02-23 NOTE — ED Notes (Signed)
Patient transported to X-ray 

## 2018-02-23 NOTE — Progress Notes (Signed)
*  Preliminary Results* Right lower extremity venous duplex completed. Right lower extremity is negative for deep vein thrombosis. There is no evidence of right Baker's cyst.  02/23/2018 11:01 AM  Blanch MediaMegan Azzam Mehra

## 2018-02-23 NOTE — ED Notes (Signed)
Pt ambulatory to and from bathroom to void, gait steady, pt tolerated well.  

## 2018-02-23 NOTE — Discharge Instructions (Signed)
You were seen in the emergency department today for chest pain. Your work-up in the emergency department has been overall reassuring. Your labs have been fairly normal and or similar to previous blood work you have had done- your creatinine a measure of kidney function is a bit elevated today, please have this rechecked by your primary care provider. Your EKG and the enzyme we use to check your heart did not show an acute heart attack at this time. Your chest x-ray was normal.   We would like you to follow up closely with your primary care provider and/or the cardiologist provided in your discharge instructions within 1 week.  We also given you information for outpatient resources for counseling, please consider this and also discuss your recent increased life stress with your primary care provider.  Return to the ER immediately should you experience any new or worsening symptoms including but not limited to return of pain, worsened pain, vomiting, shortness of breath, dizziness, lightheadedness, passing out, feelings of self harm, or any other concerns that you may have.

## 2018-02-23 NOTE — ED Triage Notes (Signed)
Patient to ED from [facility] for further evaluation of R lower leg pain, intermittent over the past 2 weeks - from calf down lower leg and into ankle/foot. She reports swelling and hematoma to R ankle x 2 days. She also endorses intermittent CP with shortness of breath x 2 weeks. Resp e/u, skin warm dry. Pedal pulses equal bilaterally. Sent over for possible vascular etiology.

## 2018-02-23 NOTE — ED Provider Notes (Signed)
[facility] EMERGENCY DEPARTMENT Provider Note   CSN: [id number] Arrival date & time: 02/23/18  1324     History   Chief Complaint Chief Complaint  Patient presents with  . Leg Pain    HPI Stephanie Buck is a 62 y.o. female with a hx of COPD (Home O2 requirement- 3L via [place] at bedtime and with exertion), depression, GERD, HTN, hyperlipidemia, hypothyroidism, chronic pain syndrome, and morbid obesity who presents to the ED from urgent care for right lower extremity pain/swelling as well as chest pain.  Patient states she is been having right lower extremity discomfort for about 2 weeks, described as being from the hip distally, especially in the back of the knee.  She states that over the past few days she has noted some swelling around the right ankle with some bruising as well.  Does not specifically hurt or appear swollen in the calf area.  She cannot recall any specific injury or change in activity.  Denies numbness, weakness, or paresthesias. Denies back pain, fever, or incontinence. Patient also with central chest discomfort which radiates to the LUE for the past 2-3 weeks.  She states that this is occurring intermittently, feels as a heaviness/pressure making it difficult to breathe.  She states that she has had a lot of anxiety and stress recently with a death in the family.  She states that every time she closes her eyes she sees her sister recently died on a ventilator with the patient at bedside, this seems to trigger her pain.    It does not seem to be pleuritic or exertional.  She has had episodes of nausea with emesis when she is crying, otherwise none. She has had very passive/fleeting SI without plan or intent, states she would never want to harm herself or others. Denies hallucinations. Denies hemoptysis, recent surgery/trauma, recent long travel, hormone use,  or hx of DVT/PE.  She does have prior history of uterine cancer with hysterectomy performed in her late  48s, no treatment in the last 6 months.   HPI  Past Medical History:  Diagnosis Date  . Arthritis    cervical spine DDD  . Asthma   . Chest pain   . COPD (chronic obstructive pulmonary disease) (HCC)   . Depression   . GERD (gastroesophageal reflux disease)   . History of oxygen administration    Oxygen 3 l/m nasally -bedtime and activity  . Hyperlipidemia   . Hypertension   . Hypothyroid   . Liver lesion    "was told 2013" - not an issue   . Thyroid disease     Patient Active Problem List   Diagnosis Date Noted  . Confusion   . Hyperglycemia   . TIA (transient ischemic attack) 09/03/2015  . Slurred speech 09/03/2015  . Shortness of breath   . Morbid obesity (HCC) 01/08/2015  . Chronic respiratory failure with hypercapnia (HCC) 01/07/2015  . Essential hypertension 01/04/2015  . Chest pain 01/04/2015  . Bleb, lung (HCC) 12/03/2011  . C. difficile diarrhea 12/03/2011  . Nausea vomiting and diarrhea 11/30/2011  . COPD GOLD III  11/30/2011  . Abdominal pain 11/30/2011  . Hyperlipidemia 11/30/2011  . CHRONIC PAIN SYNDROME 12/16/2009  . OTHER CONSTIPATION 12/16/2009  . RECTAL BLEEDING 12/16/2009    Past Surgical History:  Procedure Laterality Date  . ABDOMINAL HYSTERECTOMY    . BREAST BIOPSY Right    u/s core biopsy  . BREAST SURGERY    . CARDIAC CATHETERIZATION N/A  01/10/2015   Procedure: Left Heart Cath and Coronary Angiography;  Surgeon: Runell Gess, MD;  Location: [facility] INVASIVE CV LAB;  Service: Cardiovascular;  Laterality: N/A;  . CERVICAL DISC SURGERY     fusion-retained hardware  . COLONOSCOPY W/ POLYPECTOMY     previous hx-"just fount out about past hx polyps.  . COLONOSCOPY WITH PROPOFOL N/A 08/22/2015   Procedure: COLONOSCOPY WITH PROPOFOL;  Surgeon: Rachael Fee, MD;  Location: [facility] ENDOSCOPY;  Service: Endoscopy;  Laterality: N/A;  . EXPLORATORY LAPAROTOMY     "bikini cut" ovarian cyst, "wound dehiscence, scarring."  . TUBAL LIGATION       OB  History   None      Home Medications    Prior to Admission medications   Medication Sig Start Date End Date Taking? Authorizing Provider  albuterol (PROVENTIL HFA;VENTOLIN HFA) 108 (90 BASE) MCG/ACT inhaler Inhale 2 puffs into the lungs every 6 (six) hours as needed. For shortness of breath.    [provider]  ALPRAZolam Prudy Feeler) 1 MG tablet Take 1 mg by mouth 3 (three) times daily as needed for anxiety.     [provider]  atenolol (TENORMIN) 50 MG tablet Take 1 tablet by mouth every morning. 05/04/13   [provider]  citalopram (CELEXA) 20 MG tablet Take 1 tablet by mouth daily. 11/14/13   [provider]  CRESTOR 10 MG tablet Take 1 tablet by mouth daily. 05/10/13   [provider]  famotidine (PEPCID) 20 MG tablet TAKE 1 TABLET AT BEDTIME 04/12/15   Nyoka Cowden, MD  furosemide (LASIX) 40 MG tablet Take 1 tablet by mouth daily. 11/02/13   [provider]  glipiZIDE (GLUCOTROL) 10 MG tablet Take 10 mg by mouth daily before breakfast.    [provider]  levothyroxine (SYNTHROID, LEVOTHROID) 100 MCG tablet Take 100 mcg by mouth daily. 12/11/14   [provider]  nitroGLYCERIN (NITROSTAT) 0.3 MG SL tablet Place 1 tablet (0.3 mg total) under the tongue every 5 (five) minutes as needed for chest pain. 01/07/15   Nyoka Cowden, MD  omeprazole (PRILOSEC) 40 MG capsule Take 40 mg by mouth daily.    [provider]  oxyCODONE-acetaminophen (PERCOCET) 5-325 MG per tablet Take 1 tablet by mouth every 4 (four) hours as needed. For pain. 12/03/11   Dellinger, Tora Kindred, PA-C  OXYGEN 2lpm with sleep and prn exertion    [provider]  STIOLTO RESPIMAT 2.5-2.5 MCG/ACT AERS INHALE 2 PUFFS INTO THE LUNGS ONCE DAILY 01/17/16   Nyoka Cowden, MD    Family History Family History  Problem Relation Age of Onset  . Coronary artery disease Mother   . Stroke Father   . Prostate cancer Father   . Heart disease  Brother   . Emphysema Brother     Social History Social History   Tobacco Use  . Smoking status: Former Smoker    Packs/day: 1.50    Years: 41.00    Pack years: 61.50    Types: Cigarettes    Last attempt to quit: 11/30/2011    Years since quitting: 6.2  Substance Use Topics  . Alcohol use: No    Alcohol/week: 0.0 standard drinks  . Drug use: No     Allergies   Clindamycin/lincomycin; Ibuprofen; and Trazodone and nefazodone   Review of Systems Review of Systems  All other systems reviewed and are negative.    Physical Exam Updated Vital Signs BP (!) 108/59   Pulse  78   Temp 98.1 F (36.7 C) (Oral)   Resp 20   SpO2 95%   Physical Exam  Constitutional: She appears well-developed and well-nourished.  Non-toxic appearance. No distress.  HENT:  Head: Normocephalic and atraumatic.  Eyes: Conjunctivae are normal. Right eye exhibits no discharge. Left eye exhibits no discharge.  Neck: Neck supple.  Cardiovascular: Normal rate and regular rhythm.  No murmur heard. Pulses:      Radial pulses are 2+ on the right side, and 2+ on the left side.       Dorsalis pedis pulses are 2+ on the right side, and 2+ on the left side.       Posterior tibial pulses are 2+ on the right side, and 2+ on the left side.  Pulmonary/Chest: Effort normal and breath sounds normal. No respiratory distress. She has no wheezes. She has no rhonchi. She has no rales.  Respiration even and unlabored  Abdominal: Soft. She exhibits no distension. There is no tenderness.  Musculoskeletal:  Lower extremities: Patient does have some mild bruising to the right anterior ankle and right lateral proximal lower leg.  Trace symmetric edema bilaterally to distal lower legs.  Patient has normal range of motion to bilateral hips, knees, ankles, and all digits.  She is mildly tender over area of bruising to the right ankle as well as the posterior aspect of the knee, however no point/focal bony tenderness.  No calf  tenderness.  Compartments are soft.  Neurological: She is alert.  Clear speech.  Sensation grossly intact bilateral lower extremities.  5 out of 5 strength with plantar dorsiflexion bilaterally.  Gait steady and intact.  Skin: Skin is warm and dry. No rash noted.  Psychiatric: Her speech is normal and behavior is normal. Her mood appears anxious. She is not actively hallucinating. She expresses no homicidal and no suicidal ideation. She expresses no suicidal plans and no homicidal plans.  Tearful at times throughout exam. She is attentive.  Nursing note and vitals reviewed.  ED Treatments / Results  Labs (all labs ordered are listed, but only abnormal results are displayed) Labs Reviewed  BASIC METABOLIC PANEL - Abnormal; Notable for the following components:      Result Value   Glucose, Bld 123 (*)    Creatinine, Ser 1.14 (*)    GFR calc non Af Amer 51 (*)    GFR calc Af Amer 59 (*)    All other components within normal limits  CBC  I-STAT TROPONIN, ED  I-STAT TROPONIN, ED    EKG EKG Interpretation  Date/Time:  Wednesday February 23 2018 10:12:04 EDT Ventricular Rate:  83 PR Interval:  138 QRS Duration: 70 QT Interval:  354 QTC Calculation: 415 R Axis:   56 Text Interpretation:  Normal sinus rhythm Cannot rule out Anterior infarct , age undetermined Abnormal ECG No significant change was found Confirmed by Azalia Bilisampos, Kevin ([id number]) on 02/23/2018 1:40:01 PM   Radiology Dg Chest 2 View  Result Date: 02/23/2018 CLINICAL DATA:  Midline chest pain extending into the upper back and arm for 2 weeks. Shortness of breath. EXAM: CHEST - 2 VIEW COMPARISON:  Two-view chest x-ray 05/15/2016 FINDINGS: Heart size is normal. There is no edema or effusion. No focal airspace disease present. The visualized soft tissues and bony thorax are unremarkable. IMPRESSION: No active cardiopulmonary disease. Electronically Signed   By: Marin Robertshristopher  Mattern M.D.   On: 02/23/2018 11:14     Procedures Procedures (including critical care time)  Medications Ordered in  ED Medications - No data to display   Initial Impression / Assessment and Plan / ED Course  I have reviewed the triage vital signs and the nursing notes.  Pertinent labs & imaging results that were available during my care of the patient were reviewed by me and considered in my medical decision making (see chart for details).   Patient presents to the emergency department from urgent care due to right lower extremity pain/swelling as well as chest pain.  Patient nontoxic-appearing, no apparent distress, vitals without significant abnormality.  Exam is fairly benign, there is some mild bruising to the right ankle and right proximal lower leg, trace symmetric edema., neurovascularly intact distally.  Patient is tearful and anxious. DDX: ACS, pulmonary embolism/DVT, dissection, pneumothorax, effusion, infiltrate, arrhythmia, anemia, electrolyte derangement, MSK, GERD, psychiatric. Evaluation initiated with labs, EKG, RLE venous duplex, and CXR. Patient on cardiac monitor.   Work-up in the ER unremarkable. Labs reviewed, no leukocytosis, anemia, or significant electrolyte abnormality. Creatinine somewhat increased from prior at 1.14 today, will require PCP recheck. CXR without infiltrate, effusion, pneumothorax, or fracture/dislocation.   Patient's lower extremity venous duplex is negative for DVT, in combination with descriptions of chest pain symptoms, lack of tachycardia/hypoxia, doubt pulmonary embolism. She is NVI distally in the RLE- unclear etiology to sxs in this leg, no traumatic injuries or point/focal bony tenderness to raise concern for fracture/dislocation. Compartments are soft. Low risk heart score of 3, EKG without obvious ischemia, delta troponin negative, doubt ACS. Pain is not a tearing sensation, symmetric pulses, no widening of mediastinum on CXR, doubt dissection. Cardiac monitor reviewed. Patient has  appeared hemodynamically stable throughout ER visit and appears safe for discharge with close PCP/cardiology follow up, I also feel that outpatient counseling would be appropriate as there is possible psychiatric component- patient agreeable, resources provided, no active SI/HI/hallucinations. I discussed results, treatment plan, need for PCP/cardiology/counseling follow-up, and return precautions with the patient and her fiance at bedside. Provided opportunity for questions, patient and her fiance confirmed understanding and are in agreement with plan.   Findings and plan of care discussed with supervising physician Dr. Patria Mane who is in agreement.   Final Clinical Impressions(s) / ED Diagnoses   Final diagnoses:  Chest pain, unspecified type    ED Discharge Orders    None       Desmond Lope 02/23/18 1652    Azalia Bilis, MD 02/23/18 2124

## 2018-04-12 ENCOUNTER — Telehealth (HOSPITAL_COMMUNITY): Payer: Self-pay | Admitting: Psychiatry
